# Patient Record
Sex: Male | Born: 1950
Health system: Southern US, Community
[De-identification: ages and names within clinical notes are randomized; demographics above are authoritative.]

## PROBLEM LIST (undated history)

## (undated) DIAGNOSIS — K9189 Other postprocedural complications and disorders of digestive system: Secondary | ICD-10-CM

## (undated) DIAGNOSIS — R351 Nocturia: Secondary | ICD-10-CM

## (undated) DIAGNOSIS — C801 Malignant (primary) neoplasm, unspecified: Secondary | ICD-10-CM

## (undated) DIAGNOSIS — G709 Myoneural disorder, unspecified: Secondary | ICD-10-CM

## (undated) DIAGNOSIS — Z9989 Dependence on other enabling machines and devices: Secondary | ICD-10-CM

## (undated) DIAGNOSIS — F419 Anxiety disorder, unspecified: Secondary | ICD-10-CM

## (undated) DIAGNOSIS — R0602 Shortness of breath: Secondary | ICD-10-CM

## (undated) DIAGNOSIS — K567 Ileus, unspecified: Secondary | ICD-10-CM

## (undated) DIAGNOSIS — M1990 Unspecified osteoarthritis, unspecified site: Secondary | ICD-10-CM

## (undated) DIAGNOSIS — E119 Type 2 diabetes mellitus without complications: Secondary | ICD-10-CM

## (undated) DIAGNOSIS — K649 Unspecified hemorrhoids: Secondary | ICD-10-CM

## (undated) DIAGNOSIS — H9190 Unspecified hearing loss, unspecified ear: Secondary | ICD-10-CM

## (undated) DIAGNOSIS — I10 Essential (primary) hypertension: Secondary | ICD-10-CM

## (undated) DIAGNOSIS — F32A Depression, unspecified: Secondary | ICD-10-CM

## (undated) DIAGNOSIS — K5909 Other constipation: Secondary | ICD-10-CM

## (undated) DIAGNOSIS — G4733 Obstructive sleep apnea (adult) (pediatric): Secondary | ICD-10-CM

## (undated) DIAGNOSIS — Z8719 Personal history of other diseases of the digestive system: Secondary | ICD-10-CM

## (undated) DIAGNOSIS — Z87898 Personal history of other specified conditions: Secondary | ICD-10-CM

## (undated) DIAGNOSIS — R350 Frequency of micturition: Secondary | ICD-10-CM

## (undated) DIAGNOSIS — E785 Hyperlipidemia, unspecified: Secondary | ICD-10-CM

## (undated) DIAGNOSIS — J189 Pneumonia, unspecified organism: Secondary | ICD-10-CM

## (undated) DIAGNOSIS — Z973 Presence of spectacles and contact lenses: Secondary | ICD-10-CM

## (undated) DIAGNOSIS — R972 Elevated prostate specific antigen [PSA]: Secondary | ICD-10-CM

## (undated) HISTORY — PX: JOINT REPLACEMENT: SHX530

## (undated) HISTORY — PX: VASECTOMY: SHX75

## (undated) HISTORY — PX: COLONOSCOPY W/ POLYPECTOMY: SHX1380

---
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
## 1998-10-14 ENCOUNTER — Ambulatory Visit (HOSPITAL_BASED_OUTPATIENT_CLINIC_OR_DEPARTMENT_OTHER): Admission: RE | Admit: 1998-10-14 | Discharge: 1998-10-14 | Payer: Self-pay | Admitting: Urology

## 2000-12-11 ENCOUNTER — Encounter: Payer: Self-pay | Admitting: Emergency Medicine

## 2000-12-11 ENCOUNTER — Emergency Department (HOSPITAL_COMMUNITY): Admission: EM | Admit: 2000-12-11 | Discharge: 2000-12-11 | Payer: Self-pay | Admitting: Emergency Medicine

## 2002-12-18 ENCOUNTER — Ambulatory Visit (HOSPITAL_COMMUNITY): Admission: RE | Admit: 2002-12-18 | Discharge: 2002-12-18 | Payer: Self-pay | Admitting: Gastroenterology

## 2002-12-18 ENCOUNTER — Encounter (INDEPENDENT_AMBULATORY_CARE_PROVIDER_SITE_OTHER): Payer: Self-pay | Admitting: Specialist

## 2006-02-01 ENCOUNTER — Ambulatory Visit (HOSPITAL_COMMUNITY): Admission: RE | Admit: 2006-02-01 | Discharge: 2006-02-01 | Payer: Self-pay | Admitting: Orthopedic Surgery

## 2006-05-09 HISTORY — PX: TOTAL KNEE ARTHROPLASTY: SHX125

## 2007-02-26 ENCOUNTER — Inpatient Hospital Stay (HOSPITAL_COMMUNITY): Admission: RE | Admit: 2007-02-26 | Discharge: 2007-03-01 | Payer: Self-pay | Admitting: Orthopedic Surgery

## 2007-04-03 ENCOUNTER — Encounter: Admission: RE | Admit: 2007-04-03 | Discharge: 2007-04-03 | Payer: Self-pay | Admitting: Gastroenterology

## 2010-05-09 HISTORY — PX: OTHER SURGICAL HISTORY: SHX169

## 2010-08-24 ENCOUNTER — Other Ambulatory Visit: Payer: Self-pay | Admitting: Urology

## 2010-08-24 ENCOUNTER — Ambulatory Visit (HOSPITAL_BASED_OUTPATIENT_CLINIC_OR_DEPARTMENT_OTHER)
Admission: RE | Admit: 2010-08-24 | Discharge: 2010-08-24 | Disposition: A | Discharge status: Home / Self Care | Payer: 59 | Source: Ambulatory Visit | Attending: Urology | Admitting: Urology

## 2010-08-24 DIAGNOSIS — K219 Gastro-esophageal reflux disease without esophagitis: Secondary | ICD-10-CM | POA: Insufficient documentation

## 2010-08-24 DIAGNOSIS — R972 Elevated prostate specific antigen [PSA]: Secondary | ICD-10-CM | POA: Insufficient documentation

## 2010-08-24 DIAGNOSIS — N400 Enlarged prostate without lower urinary tract symptoms: Secondary | ICD-10-CM | POA: Insufficient documentation

## 2010-08-24 DIAGNOSIS — I10 Essential (primary) hypertension: Secondary | ICD-10-CM | POA: Insufficient documentation

## 2010-08-24 DIAGNOSIS — G4733 Obstructive sleep apnea (adult) (pediatric): Secondary | ICD-10-CM | POA: Insufficient documentation

## 2010-08-24 DIAGNOSIS — E669 Obesity, unspecified: Secondary | ICD-10-CM | POA: Insufficient documentation

## 2010-08-24 DIAGNOSIS — R9431 Abnormal electrocardiogram [ECG] [EKG]: Secondary | ICD-10-CM | POA: Insufficient documentation

## 2010-08-24 DIAGNOSIS — Z01812 Encounter for preprocedural laboratory examination: Secondary | ICD-10-CM | POA: Insufficient documentation

## 2010-08-24 DIAGNOSIS — E119 Type 2 diabetes mellitus without complications: Secondary | ICD-10-CM | POA: Insufficient documentation

## 2010-08-24 DIAGNOSIS — Z01810 Encounter for preprocedural cardiovascular examination: Secondary | ICD-10-CM | POA: Insufficient documentation

## 2010-08-24 LAB — POCT I-STAT 4, (NA,K, GLUC, HGB,HCT)
Glucose, Bld: 147 mg/dL — ABNORMAL HIGH (ref 70–99)
Hemoglobin: 13.9 g/dL (ref 13.0–17.0)
Sodium: 141 mEq/L (ref 135–145)

## 2010-08-24 LAB — GLUCOSE, CAPILLARY: Glucose-Capillary: 134 mg/dL — ABNORMAL HIGH (ref 70–99)

## 2010-09-04 NOTE — Op Note (Signed)
  NAMESENDER, RUEB                ACCOUNT NO.:  [id number]  MEDICAL RECORD NO.:  [id number]          PATIENT TYPE:  LOCATION:                                 FACILITY:  PHYSICIAN:  Danae Chen, M.D.       DATE OF BIRTH:  DATE OF PROCEDURE:  08/24/2010 DATE OF DISCHARGE:                              OPERATIVE REPORT   PREOPERATIVE DIAGNOSIS:  Elevated prostate-specific antigen.  Rule out adenocarcinoma of the prostate.  POSTOPERATIVE DIAGNOSIS:  Elevated prostate-specific antigen.  Rule out adenocarcinoma of the prostate.  PROCEDURE DONE:  Ultrasound biopsy of the prostate.  SURGEON:  Danae Chen, M.D.  ANESTHESIA:  General.  INDICATIONS:  The patient is a 60 year old male who has an elevated PSA of 14.08.  He had a negative prostate biopsy in September 2008 for a PSA of 5.37.  His PSA has been trending up and it is now 14.08.  He is scheduled for ultrasound prostate biopsy.  The patient did not want to have a repeat biopsy under local anesthesia, so he is scheduled for the procedure under general anesthesia.  PROCEDURE IN DETAIL:  The patient was identified by his wrist band and a proper time-out was taken.  Under general anesthesia, he was prepped and draped and placed in the left lateral decubitus position.  The transducer was inserted in the rectum.  An ultrasound of the prostate was done.  The seminal vesicles appear normal.  The prostate gland is enlarged and measures 88.72 cc. The prostate width is 5.44 cm.  The height is 5 cm and the length is 6.20 cm.  There is no definite evidence of hypoechoic nodules.  Then two biopsies of the right base, right mid gland and right apex were done. Then two biopsies of the left base, the left mid gland and left apex were done.  There was no evidence of bleeding at the end of the procedure.  The transducer was removed.  The patient tolerated the procedure well and left the OR in satisfactory condition to the Post-Anesthesia  Care Unit.     Danae Chen, M.D.     MN/MEDQ  D:  08/24/2010  T:  08/24/2010  Job:  [id number]  Electronically Signed by Lindaann Slough M.D. on 09/04/2010 10:52:51 AM

## 2010-09-21 NOTE — Discharge Summary (Signed)
NAMECHRISTORPHER, [name]                ACCOUNT NO.:  [id number]   MEDICAL RECORD NO.:  [id number]          PATIENT TYPE:  INP   LOCATION:  5032                         FACILITY:  [facility]   PHYSICIAN:  Elana Alm. Thurston Hole, M.D. DATE OF BIRTH:  December 31, 1950   DATE OF ADMISSION:  02/26/2007  DATE OF DISCHARGE:  03/01/2007                               DISCHARGE SUMMARY   ADMITTING DIAGNOSES:  1. End-stage degenerative joint disease, left knee.  2. High cholesterol.  3. Depression.  4. Sleep apnea with the use of a continuous positive airway pressure.   DISCHARGE DIAGNOSES:  1. End-stage degenerative joint disease, left knee.  2. High cholesterol.  3. Depression.  4. Sleep apnea.  5. Ileus.  6. Obesity.  7. Leukocytosis.   HISTORY OF PRESENT ILLNESS:  The patient is a 60 year old white male  with a history of end-stage DJD of both knees.  His left is more painful  than his right knee.  He has failed conservative care, including  interarticular cortisone injections, anti-inflammatories, and physical  therapy.  He understands the risk, benefits, and possible complications  of a left total knee replacement and is without question.   PROCEDURES IN HOUSE:  On February 26, 2007, the patient underwent a left  total knee replacement and a femoral nerve block by anesthesia.  Intraoperatively, an Autovac was placed, and postoperatively he received  autologous transfusion of blood collected in Autovac.  He tolerated all  these procedures well.   In the recovery room, he was placed in a CPM.  He had difficulty with  nausea and vomiting.  A CPAP was brought to him.  He did not use it due  to his significant nausea and vomiting.  On the morning of postoperative  day #1, the patient continued to have significant nausea and vomiting.  His abdomen was distended and firm.  He had decreased bowel sounds.  Chest x-ray and CT were ordered that showed no abnormality.  A flat  plate abdomen was ordered that did  show an ileus.  GI consult was called  for [facility] to see the patient, and they recommended Reglan 10 mg IV  q.6h.  They recommended ambulation and sitting up in a chair.  An NG  tube was attempted to be placed at bedside and was unsuccessful.  Therefore, it was placed via fluoroscopic guidance.  There was  difficulty passing the NG tube through the esophageal junction, so he  will need followup for this.  He did not sleep with his CPAP for a  second night due to NG tube placement.   On postoperative day #2, NG tube was still in place.  The patient was  feeling better.  He was passing gas.  Still no bowel movement.  White  cell count of 16.9, T max of 100.5.  His Foley was discontinued.  He was  up with physical therapy. IV Reglan was continued.   On postoperative day #3, a trial of clear liquids was tried with  clamping his NG tube.  He tolerated this.  He was placed on MiraLax  p.r.n.  at home.  He did very well with physical therapy, ambulating 200  feet but only tolerating the CPM 0-50 degrees.   On postoperative day #4, the patient did better with the CPM, tolerating  it 0-70 degrees.  He was taking p.o.'s.  He had a successful bowel  movement.  He was discharged to home on Tylenol for pain, Lovenox 30 mg  p.o. b.i.d., Robaxin for muscle spasm, and Coumadin.  He will receive  home health physical therapy and skilled nursing.  A rolling walker was  ordered as well as an elevated commode seat.  He will follow up with Dr.  Thurston Hole on March 10, 2007.  He will follow up with GI in the next 2  weeks to have his possible esophageal stricture evaluated.  He is to  call us with increased pain, increased swelling, increased redness, or a  temperature greater than 101.      Kirstin Shepperson, P.A.      Robert A. Thurston Hole, M.D.  Electronically Signed    KS/MEDQ  D:  03/22/2007  T:  03/22/2007  Job:  [id number]

## 2010-09-21 NOTE — Op Note (Signed)
NAMEJAIS, DEMIR                ACCOUNT NO.:  [id number]   MEDICAL RECORD NO.:  [id number]          PATIENT TYPE:  INP   LOCATION:  2899                         FACILITY:  [facility]   PHYSICIAN:  Elana Alm. Thurston Hole, M.D. DATE OF BIRTH:  09-10-1950   DATE OF PROCEDURE:  DATE OF DISCHARGE:                               OPERATIVE REPORT   PREOPERATIVE DIAGNOSIS:  Left knee degenerative joint disease.   POSTOPERATIVE DIAGNOSIS:  Left knee degenerative joint disease.   PROCEDURE:  Left total knee replacing using DePuy cemented total knee  system with #4 cemented femur and #4 cemented tibia with 15-mm  polyethylene RP tibial spacer and 35-mm polyethylene cemented patella.   SURGEON:  Elana Alm. Thurston Hole, M.D.   ASSISTANT:  Julien Girt, P.A.   ANESTHESIA:  General.   OPERATIVE TIME:  1 hour and 20 minutes.   COMPLICATIONS:  None.   DESCRIPTION OF PROCEDURE:  Ms. Peloquin was brought to the operating room  on February 26, 2007 after a femoral nerve block was placed in the  holding room by anesthesia.  He was placed on the operative table in  supine position.  After being placed under general anesthesia, he  received Ancef 2 grams IV preoperatively for prophylaxis.  He had a  Foley catheter placed under sterile conditions.  His left knee was  examined under anesthesia.  He has range of motion -5 to 125 degrees,  mild varus deformity.  The knee is stable on ligamentous exam with  normal patellar tracking.  The left leg was prepped using sterile  DuraPrep and draped using sterile technique.  The leg was exsanguinated  and a thigh tourniquet elevated 365-mm.  Initially through a 15-cm  longitudinal incision based over the patella, initial exposure was made.  The underlying subcutaneous tissues were incised along with skin  incision.  A median arthrotomy was performed revealing an excessive  amount of normal appearing joint fluid.  The articular surfaces were  inspected.  He had grade IV  changes medially, laterally and in the  patellofemoral joint.  Osteophytes were removed from the femoral  condyles and tibial plateau.  The medial and lateral meniscal remnants  were removed, as well as the anterior cruciate ligament.  Intramedullary  drill was then drilled up the femoral canal for placement of distal  femoral cutting jig, which was placed in the appropriate amount of  rotation and a distal 11-mm cut was made.  The distal femur was incised.  A #4 was found to be appropriate size.  A #4 cutting jig was placed in  the appropriate amount of external rotation and then these cuts were  made.  The proximal tibia was then exposed.  The tibial spines were  removed with an oscillating saw.  Intramedullary drill was drilled down  the tibial canal for placement of the proximal tibial cutting jig, which  was placed in the appropriate amount of rotation and a 6-mm cut was made  based off the medial and lower side.  Spacer blocks were then placed in  flexion and extension.  15-mm blocks gave excellent balancing,  excellent  stability and excellent correction of his flexion and varus deformities.  At this point, the #4 tibial base plate trial was placed on the cut  tibial surface and a Keel cut was made.  A #4 PCL block cutter was  placed on the distal femur and then these cuts were made.  After this  done, then the patella was sized.  A resurfacing 10-mm cut was made and  three locking holes placed for a 35-mm patella.  At this point, the #4  femoral trial was placed and with a #4 tibial base plate trial and a 15-  mm polyethylene RP tibial spacer and a 35-mm patellar trial.  The trial  components were placed and the knee reduced, taken through a range of  motion from 0-125 degrees with excellent stability and excellent  correction of his flexion and varus deformities and normal patellar  tracking.  At this point, it was felt that all the trial components were  of excellent size, fit and  stability.  They were then removed.  The knee  was then jet lavaged and irrigated with 3 liters of saline.  The  proximal tibia was then exposed and the #4 tibial base plate with cement  backing was hammered in position with an excellent fit with excess  cement being removed from around the edges.  A #4 femoral component with  cement backing was hammered in position, also with an excellent fit with  excess cement being removed from around the edges.  The 15-mm  polyethylene RP tibial spacer was placed on tibial base plate and the  knee reduced, taken through a full range of motion 0-125 degrees with  excellent stability, excellent correction of his flexus and varus  deformities.  35-mm polyethylene cement-backed patella was then placed  in its position and held there with a clamp.  After the cement hardened,  again patellofemoral tracking was evaluated and found to be normal.  At  this point, it was felt that all the components were of excellent size,  fit and stability.  The wound was further irrigated with saline.  The  tourniquet was released.  Hemostasis was obtained with cautery.  The  arthrotomy was then closed with #1 Ethibond suture over 2 medium Hemovac  drains.  Subcutaneous tissues were closed with 0 and 2-0 Vicryl,  subcuticular layer closed with 4-0 Monocryl.  Sterile dressings and long-  leg splint applied.  Patient then awakened, extubated and taken to  recovery room in stable condition.  Needle and sponge counts correct x2  at the end of the case.  Neurovascular status and pulses 2+ and  symmetric postoperatively.      Robert A. Thurston Hole, M.D.  Electronically Signed     RAW/MEDQ  D:  02/26/2007  T:  02/26/2007  Job:  [id number]

## 2010-09-21 NOTE — Consult Note (Signed)
NAMEJORIAN, WILLHOITE NO.:  [id number]   MEDICAL RECORD NO.:  [id number]          PATIENT TYPE:  INP   LOCATION:  5032                         FACILITY:  [facility]   PHYSICIAN:  Shirley Friar, MDDATE OF BIRTH:  12-Oct-1950   DATE OF CONSULTATION:  02/27/2007  DATE OF DISCHARGE:                                 CONSULTATION   REASON FOR CONSULTATION:  We were asked to see Mr. Fines today in  consultation by Julien Girt, P.A. for Dr. Elana Alm. Wainer for  postop ileus.   HISTORY OF PRESENT ILLNESS:  This is a very pleasant 60 year old male  who underwent a left total knee replacement on October 20 for severe  degenerative joint disease.  He is currently dry heaving and retching.  He states that he has never experienced these symptoms previously and  that his bowel movements prior to surgery were every day and easy to  pass.  He has not seen any blood in his bowel movements.  He currently  has a small amount of bright red blood in his emesis secondary to NG  trauma as his RNs have recently tried to place an NG.  Mr. Wey tells  me that he has had a colonoscopy in the recent years for screening but  cannot currently remember who did it.  History taking was limited due to  his current discomfort.   PAST MEDICAL HISTORY:  Is significant for degenerative joint disease,  depression, high cholesterol, obesity and obstructive sleep apnea.  He  is status post appendectomy in 1963.  He has had a vasectomy.   PRIMARY CARE PHYSICIAN:  Georgann Housekeeper, MD   CURRENT MEDICATIONS:  1. Crestor.  2. Diclofenac.  3. MVT.  4. Fish oil.  5. Tylenol.  6. Fluoxetine.  7. Hydrocodone.   ALLERGIES:  No known drug allergies.   Review of systems, social history, family history were not collected as  the patient was retching.   PHYSICAL EXAM:  He is alert and oriented.  He is in some distress and  discomfort.  His temperature is 98.1.  Earlier this morning he had a  temperature of 100.6.  Respirations are 18, pulse is 91, blood pressure  is 137/78.  His heart has a regular rate and rhythm.  His lungs were  clear to auscultation.  His abdomen is distended, has very few but  tympanic bowel sounds.  He is nontender.   Current labs show a good potassium of 4.1, BUN of 9, creatinine 0.96,  glucose 168.  His white blood count is elevated at 21.4, hemoglobin  13.2, hematocrit 38.1, platelets 295,000.  Abdominal x-ray done today  shows a markedly distended stomach with gas.   ASSESSMENT:  Dr. Charlott Rakes has seen and examined the patient and  collected a history and reviewed his chart.  His impression is that this  is a 60 year old male with postop ileus, agree with Kirstin Shepperson's  recommendations of NG tube placement to low or intermittent suction as  well as discontinuing opiate pain medications and making the patient  n.p.o.  Will also add  Reglan and ask interventional radiology to place  the NG tube, assist the patient with ambulation as much as possible as  permitted by his primary care team, orthopedics.  If no results in 24-48  hours could do a trial of erythromycin.  Thanks very much for this  consultation.      Stephani Police, [place]      Shirley Friar, MD  Electronically Signed    MLY/MEDQ  D:  02/27/2007  T:  02/28/2007  Job:  [id number]   cc:   Elana Alm. Thurston Hole, M.D.  Georgann Housekeeper, MD  Shirley Friar, MD

## 2010-09-24 NOTE — Op Note (Signed)
   NAME:  [name], [name]                          ACCOUNT NO.:  [id number]   MEDICAL RECORD NO.:  [id number]                   PATIENT TYPE:  AMB   LOCATION:  ENDO                                 FACILITY:  [facility]   PHYSICIAN:  Danise Edge, M.D.                DATE OF BIRTH:  June 07, 1950   DATE OF PROCEDURE:  12/18/2002  DATE OF DISCHARGE:                                 OPERATIVE REPORT   PROCEDURE:  Colonoscopy.   PROCEDURE INDICATION:  Mr. [name] is a 60 year old male scheduled to  undergo his first screening colonoscopy with polypectomy to prevent colon  cancer.  Mr. [name] was born 1951-03-15.   ENDOSCOPIST:  Charolett Bumpers, M.D.   PREMEDICATION:  Versed 10 mg, Demerol 75 mg.   PROCEDURE:  After obtaining informed consent, Mr. [name] was placed in the  left lateral decubitus position.  I administered intravenous Demerol and  intravenous Versed to achieve conscious sedation for the procedure.  The  patient's blood pressure, oxygen saturation, and cardiac rhythm were  monitored throughout the procedure and documented in the medical record.   Anal inspection was normal.  Digital rectal exam revealed a non-nodular  prostate.  The Olympus adult colonoscope was introduced into the rectum and  advanced to the cecum.  Colonic preparation for the exam today was  excellent.   Rectum:  From the mid rectum a 2-mm sessile polyp was removed with the hot  biopsy forceps.   Sigmoid colon and descending colon:  At 60 cm from the anal verge a 2-mm  sessile polyp was removed with hot biopsy forceps.   Splenic flexure:  Normal.   Transverse colon:  From the proximal transverse colon a 1-mm sessile polyp  was removed with the cold biopsy forceps.   Hepatic flexure:  Normal.   Ascending colon:  Normal.   Cecum and ileocecal valve:  Normal.   ASSESSMENT:  A 1-mm polyp was removed from the proximal transverse colon, a  2-mm polyp was removed from the left colon at 60 cm  from the anal verge, and  a 2-mm polyp was removed from the mid rectum.  All polyps were submitted in  one bottle for pathologic evaluation.    RECOMMENDATIONS:  If polyps return neoplastic pathologically, Mr. [name]  should undergo a repeat colonoscopy in five years.                                               Danise Edge, M.D.    MJ/MEDQ  D:  12/18/2002  T:  12/18/2002  Job:  [id number]   cc:   Georgann Housekeeper, M.D.  [address]  [place]  [place] [address]  Fax: [phone]

## 2011-02-16 LAB — CBC
HCT: 38.1 — ABNORMAL LOW
Hemoglobin: 12.1 — ABNORMAL LOW
Hemoglobin: 13.2
MCHC: 34.6
MCHC: 35.2
MCHC: 35.6
MCV: 90.1
MCV: 90.9
Platelets: 263
Platelets: 266
Platelets: 295
RBC: 3.78 — ABNORMAL LOW
RBC: 4.19 — ABNORMAL LOW
RDW: 13.4
RDW: 13.5
RDW: 13.8
WBC: 15.5 — ABNORMAL HIGH
WBC: 21.4 — ABNORMAL HIGH

## 2011-02-16 LAB — URINALYSIS, ROUTINE W REFLEX MICROSCOPIC
Bilirubin Urine: NEGATIVE
Glucose, UA: NEGATIVE
Ketones, ur: NEGATIVE
Leukocytes, UA: NEGATIVE
Nitrite: NEGATIVE
Protein, ur: NEGATIVE
Specific Gravity, Urine: >1.046 — ABNORMAL HIGH
Urobilinogen, UA: 0.2
pH: 5

## 2011-02-16 LAB — PROTIME-INR
INR: 1
INR: 1.2
Prothrombin Time: 13.3
Prothrombin Time: 14.6
Prothrombin Time: 15.5 — ABNORMAL HIGH

## 2011-02-16 LAB — URINE MICROSCOPIC-ADD ON

## 2011-02-16 LAB — CULTURE, BLOOD (ROUTINE X 2)
Culture: NO GROWTH
Culture: NO GROWTH

## 2011-02-16 LAB — BASIC METABOLIC PANEL WITH GFR
BUN: 10
BUN: 9
CO2: 25
CO2: 27
Calcium: 8.8
Calcium: 8.9
Chloride: 100
Chloride: 106
Creatinine, Ser: 0.96
Creatinine, Ser: 0.99
GFR calc non Af Amer: >60
GFR calc non Af Amer: >60
Glucose, Bld: 137 — ABNORMAL HIGH
Glucose, Bld: 168 — ABNORMAL HIGH
Potassium: 3.8
Potassium: 4.1
Sodium: 136
Sodium: 141

## 2011-02-16 LAB — URINE CULTURE
Colony Count: NO GROWTH
Culture: NO GROWTH
Special Requests: NEGATIVE

## 2011-02-16 LAB — BASIC METABOLIC PANEL
CO2: 28
Sodium: 142

## 2011-02-17 LAB — PROTIME-INR: INR: 0.9

## 2011-02-17 LAB — COMPREHENSIVE METABOLIC PANEL
Albumin: 4
BUN: 9
Chloride: 106
Creatinine, Ser: 1
Total Bilirubin: 0.8
Total Protein: 7.2

## 2011-02-17 LAB — URINALYSIS, ROUTINE W REFLEX MICROSCOPIC
Hgb urine dipstick: NEGATIVE
Nitrite: NEGATIVE
Specific Gravity, Urine: 1.017
Urobilinogen, UA: 0.2

## 2011-02-17 LAB — DIFFERENTIAL
Basophils Absolute: 0
Lymphocytes Relative: 30
Monocytes Absolute: 0.8 — ABNORMAL HIGH
Neutro Abs: 6.8

## 2011-02-17 LAB — CBC
HCT: 42
MCV: 91.6
Platelets: 325
RDW: 13.8

## 2011-02-17 LAB — APTT: aPTT: 27

## 2011-02-17 LAB — URINE CULTURE

## 2011-02-17 LAB — ABO/RH: ABO/RH(D): A POS

## 2011-02-17 LAB — TYPE AND SCREEN

## 2012-07-22 ENCOUNTER — Emergency Department (INDEPENDENT_AMBULATORY_CARE_PROVIDER_SITE_OTHER): Payer: 59

## 2012-07-22 ENCOUNTER — Emergency Department (HOSPITAL_COMMUNITY)
Admission: EM | Admit: 2012-07-22 | Discharge: 2012-07-22 | Disposition: A | Discharge status: Home / Self Care | Payer: 59 | Source: Home / Self Care | Attending: Emergency Medicine | Admitting: Emergency Medicine

## 2012-07-22 ENCOUNTER — Encounter (HOSPITAL_COMMUNITY): Payer: Self-pay

## 2012-07-22 DIAGNOSIS — J40 Bronchitis, not specified as acute or chronic: Secondary | ICD-10-CM

## 2012-07-22 DIAGNOSIS — J069 Acute upper respiratory infection, unspecified: Secondary | ICD-10-CM

## 2012-07-22 HISTORY — DX: Essential (primary) hypertension: I10

## 2012-07-22 MED ORDER — IPRATROPIUM BROMIDE 0.02 % IN SOLN
0.5000 mg | Freq: Once | RESPIRATORY_TRACT | Status: AC
  Administered 2012-07-22: 0.5 mg via RESPIRATORY_TRACT

## 2012-07-22 MED ORDER — HYDROCOD POLST-CHLORPHEN POLST 10-8 MG/5ML PO LQCR: 5.0000 mL | Freq: Two times a day (BID) | ORAL | Status: DC | PRN

## 2012-07-22 MED ORDER — AZITHROMYCIN 250 MG PO TABS: 250.0000 mg | ORAL_TABLET | Freq: Every day | ORAL | Status: DC

## 2012-07-22 MED ORDER — ALBUTEROL SULFATE (5 MG/ML) 0.5% IN NEBU
INHALATION_SOLUTION | RESPIRATORY_TRACT | Status: AC
  Filled 2012-07-22: qty 1

## 2012-07-22 MED ORDER — ALBUTEROL SULFATE (5 MG/ML) 0.5% IN NEBU
5.0000 mg | INHALATION_SOLUTION | Freq: Once | RESPIRATORY_TRACT | Status: AC
  Administered 2012-07-22: 5 mg via RESPIRATORY_TRACT

## 2012-07-22 MED ORDER — BENZONATATE 100 MG PO CAPS: 100.0000 mg | ORAL_CAPSULE | Freq: Three times a day (TID) | ORAL | Status: DC | PRN

## 2012-07-22 MED ORDER — ALBUTEROL SULFATE HFA 108 (90 BASE) MCG/ACT IN AERS: 1.0000 | INHALATION_SPRAY | Freq: Four times a day (QID) | RESPIRATORY_TRACT | Status: DC | PRN

## 2012-07-22 NOTE — ED Notes (Signed)
States  he has been sick since last week when he was exposed to ill grandchild

## 2012-07-26 NOTE — ED Provider Notes (Deleted)
History     CSN: [id number]  Arrival date & time 07/22/12  1308   First MD Initiated Contact with Patient 07/22/12 1337      Chief Complaint  Patient presents with  . URI    HPI: Patient is a 62 y.o. male presenting with URI. The history is provided by the patient.  URI Presenting symptoms: congestion, cough, fatigue and sore throat   Presenting symptoms: no ear pain, no facial pain, no fever and no rhinorrhea   Congestion:    Location:  Chest   Interferes with sleep: yes     Interferes with eating/drinking: no   Cough:    Cough characteristics:  Productive and harsh   Sputum characteristics:  Green and yellow   Severity:  Severe   Onset quality:  Gradual   Duration:  3 days   Timing:  Intermittent   Progression:  Worsening   Chronicity:  Recurrent Sore throat:    Severity:  Mild   Onset quality:  Gradual   Timing:  Constant   Progression:  Improving Chronicity:  New Ineffective treatments:  None tried Associated symptoms: no sneezing and no wheezing   Risk factors: being elderly, chronic respiratory disease and sick contacts   Pt reports 3 to 4 days URI symptoms. Denies fever. States started as increased PND and mild throat irritation that progressed to lots of "chest congestion" then persistent coughing. The sore throat has improved but the cough has worsened. Cough is productive of yellowish-green secretions and makes sleeping difficult. States his wife and grandson have had similar symptoms. Pt does not smoke and has no h/o asthma, however states he has had numerous episodes of Bronchitis. Denies CP or SOB.   Past Medical History  Diagnosis Date  . Diabetes mellitus without complication   . Hypertension   . High cholesterol     Past Surgical History  Procedure Laterality Date  . Tonsillectomy    . Vasectomy    . Knee surgery      History reviewed. No pertinent family history.  History  Substance Use Topics  . Smoking status: Never Smoker   . Smokeless  tobacco: Not on file  . Alcohol Use: No      Review of Systems  Constitutional: Positive for fatigue. Negative for fever.  HENT: Positive for congestion and sore throat. Negative for ear pain, rhinorrhea, sneezing, trouble swallowing and sinus pressure.   Respiratory: Positive for cough. Negative for shortness of breath and wheezing.   Cardiovascular: Negative for chest pain.  Gastrointestinal: Negative for nausea, vomiting, abdominal pain, diarrhea and abdominal distention.  Endocrine: Negative.   Genitourinary: Negative.   Musculoskeletal: Negative.   Skin: Negative.   Allergic/Immunologic: Negative.   Neurological: Negative.   Hematological: Negative.   Psychiatric/Behavioral: Negative.     Allergies  Review of patient's allergies indicates no known allergies.  Home Medications   Current Outpatient Rx  Name  Route  Sig  Dispense  Refill  . FLUoxetine (PROZAC) 20 MG tablet   Oral   Take 20 mg by mouth daily.         . metFORMIN (GLUCOPHAGE) 500 MG tablet   Oral   Take 500 mg by mouth 3 (three) times daily.         . ramipril (ALTACE) 10 MG tablet   Oral   Take 10 mg by mouth daily.         . simvastatin (ZOCOR) 10 MG tablet   Oral   Take 10 mg  by mouth at bedtime.         . sitaGLIPtin (JANUVIA) 25 MG tablet   Oral   Take 25 mg by mouth daily.         Marland Kitchen albuterol (PROVENTIL HFA;VENTOLIN HFA) 108 (90 BASE) MCG/ACT inhaler   Inhalation   Inhale 1-2 puffs into the lungs every 6 (six) hours as needed for wheezing or shortness of breath (and or coughing).   1 Inhaler   0   . azithromycin (ZITHROMAX Z-PAK) 250 MG tablet   Oral   Take 1 tablet (250 mg total) by mouth daily. Take 2 tabs on day 1 then 1 tab daily on days 2-5   6 tablet   0   . benzonatate (TESSALON) 100 MG capsule   Oral   Take 1 capsule (100 mg total) by mouth 3 (three) times daily as needed for cough.   21 capsule   0   . chlorpheniramine-HYDROcodone (TUSSIONEX PENNKINETIC ER)  10-8 MG/5ML LQCR   Oral   Take 5 mLs by mouth every 12 (twelve) hours as needed.   40 mL   0     BP 135/75  Pulse 77  Temp(Src) 98.5 F (36.9 C) (Oral)  Resp 21  SpO2 98%  Physical Exam  Constitutional: He is oriented to person, place, and time. He appears well-developed and well-nourished.  HENT:  Head: Normocephalic and atraumatic.  Right Ear: Tympanic membrane, external ear and ear canal normal.  Left Ear: Tympanic membrane, external ear and ear canal normal.  Nose: Nose normal.  Mouth/Throat: Uvula is midline, oropharynx is clear and moist and mucous membranes are normal.  Cobblestoning   Eyes: Conjunctivae are normal.  Neck: Neck supple.  Cardiovascular: Normal rate and regular rhythm.   Pulmonary/Chest: Effort normal. He has wheezes.  BBS diminished w/ mild expiratory wheezes.  Musculoskeletal: Normal range of motion.  Neurological: He is alert and oriented to person, place, and time.  Skin: Skin is warm and dry.  Psychiatric: He has a normal mood and affect.    ED Course  Procedures (including critical care time)  Labs Reviewed - No data to display No results found.   1. Bronchitis   2. Upper respiratory infection       MDM  3-4 days URI symptoms. Worse symptoms are sore throat and persistent cough. Mx family members w/ similar sx's. No fever. CXR negative for PNA. Hx significant for mx episodes of Bronchitis. Will treat for same w/ Z-Pack, Albuterol HFA, Tessalon Pearles and short course of medication for cough to use primarily at night. Pt is to arrange f/u w/ PCP. Pt agreeable w/ plan.        Leanne Chang, NP 07/26/12 0300  Leanne Chang, NP 07/26/12 1610

## 2013-05-07 ENCOUNTER — Emergency Department (HOSPITAL_COMMUNITY)
Admission: EM | Admit: 2013-05-07 | Discharge: 2013-05-07 | Disposition: A | Discharge status: Home / Self Care | Payer: 59 | Attending: Emergency Medicine | Admitting: Emergency Medicine

## 2013-05-07 ENCOUNTER — Encounter (HOSPITAL_COMMUNITY): Payer: Self-pay | Admitting: Emergency Medicine

## 2013-05-07 ENCOUNTER — Emergency Department (HOSPITAL_COMMUNITY): Payer: 59

## 2013-05-07 DIAGNOSIS — M5412 Radiculopathy, cervical region: Secondary | ICD-10-CM | POA: Insufficient documentation

## 2013-05-07 DIAGNOSIS — Z9089 Acquired absence of other organs: Secondary | ICD-10-CM | POA: Insufficient documentation

## 2013-05-07 DIAGNOSIS — R209 Unspecified disturbances of skin sensation: Secondary | ICD-10-CM | POA: Insufficient documentation

## 2013-05-07 DIAGNOSIS — E785 Hyperlipidemia, unspecified: Secondary | ICD-10-CM | POA: Insufficient documentation

## 2013-05-07 DIAGNOSIS — Z7982 Long term (current) use of aspirin: Secondary | ICD-10-CM | POA: Insufficient documentation

## 2013-05-07 DIAGNOSIS — M25519 Pain in unspecified shoulder: Secondary | ICD-10-CM | POA: Insufficient documentation

## 2013-05-07 DIAGNOSIS — R05 Cough: Secondary | ICD-10-CM | POA: Insufficient documentation

## 2013-05-07 DIAGNOSIS — J3489 Other specified disorders of nose and nasal sinuses: Secondary | ICD-10-CM | POA: Insufficient documentation

## 2013-05-07 DIAGNOSIS — E119 Type 2 diabetes mellitus without complications: Secondary | ICD-10-CM | POA: Insufficient documentation

## 2013-05-07 DIAGNOSIS — Z79899 Other long term (current) drug therapy: Secondary | ICD-10-CM | POA: Insufficient documentation

## 2013-05-07 DIAGNOSIS — R059 Cough, unspecified: Secondary | ICD-10-CM | POA: Insufficient documentation

## 2013-05-07 DIAGNOSIS — I10 Essential (primary) hypertension: Secondary | ICD-10-CM | POA: Insufficient documentation

## 2013-05-07 LAB — POCT I-STAT TROPONIN I: Troponin i, poc: 0 ng/mL (ref 0.00–0.08)

## 2013-05-07 MED ORDER — HYDROCODONE-ACETAMINOPHEN 5-325 MG PO TABS: 1.0000 | ORAL_TABLET | ORAL | Status: DC | PRN

## 2013-05-07 NOTE — ED Notes (Signed)
Pt reports pain in his left shoulder that radiates down to his arm and fingers. Reports his fingers are tingling.Pt also reports pain radiates to his back, sts taken ibuprofen and used icyhot without relief.

## 2013-05-07 NOTE — ED Provider Notes (Signed)
Medical screening examination/treatment/procedure(s) were performed by non-physician practitioner and as supervising physician I was immediately available for consultation/collaboration.  EKG Interpretation   None         Enid Skeens, MD 05/07/13 [phone]

## 2013-05-07 NOTE — ED Provider Notes (Signed)
CSN: [id number]     Arrival date & time 05/07/13  1217 History  This chart was scribed for non-physician practitioner Trixie Dredge, PA-C working with Enid Skeens, MD by Valera Castle, ED scribe. This patient was seen in room WTR1/WLPT1 and the patient's care was started at 12:57 PM.    Chief Complaint  Patient presents with  . Shoulder Pain    The history is provided by the patient. No language interpreter was used.   HPI Comments: [name] is a 62 y.o. male who presents to the Emergency Department complaining of gradually worsening, moderate, constant, left shoulder pain, that radiates to his left shoulder blade and down his left arm to his fingers, onset 4-5 days ago. He reports mild loss of sensation in his left fingers, and states they feel stiff. He reports associated neck pain. He reports standing up sometimes helps relieve the pain, as opposed to sitting down. He reports that sometimes moving, walking helps relieve his pain, but sometimes it will exacerbate his pain. He denies any strenuous activities at his work. He reports trouble sleeping due to the pain, stating he tosses and turns a lot. He reports having tried icy hot over the area, with temporary relief, and reports taking Ibuprofen with relief as well. He denies recent trauma to his shoulder, but reports having similar left shoulder pain 1 month ago after developing a sharp pain while lifting something. He denies his previous shoulder pain radiating like his pain is radiating currently. He also reports mild, intermittent, dry cough and mild congestion. He reports h/o allergies. He denies chest pain, SOB, light-headedness, dizziness, and any other associated symptoms. He reports having Type 2 DM, HTN, hypercholesterolemia.    PCP Georgann Housekeeper, MD  Past Medical History  Diagnosis Date  . Diabetes mellitus without complication   . Hypertension   . High cholesterol    Past Surgical History  Procedure Laterality Date  .  Tonsillectomy    . Vasectomy    . Knee surgery     History reviewed. No pertinent family history. History  Substance Use Topics  . Smoking status: Never Smoker   . Smokeless tobacco: Not on file  . Alcohol Use: No    Review of Systems  HENT: Positive for congestion.   Respiratory: Positive for cough (dry). Negative for shortness of breath.   Musculoskeletal: Positive for arthralgias (left shoulder) and neck pain.  Neurological: Positive for numbness (left fingers). Negative for dizziness and light-headedness.    Allergies  Review of patient's allergies indicates no known allergies.  Home Medications   Current Outpatient Rx  Name  Route  Sig  Dispense  Refill  . aspirin 81 MG chewable tablet   Oral   Chew 81 mg by mouth daily.         . calcium-vitamin D (OSCAL WITH D) 500-200 MG-UNIT per tablet   Oral   Take 1 tablet by mouth 2 (two) times daily.         Marland Kitchen FLUoxetine (PROZAC) 20 MG tablet   Oral   Take 20 mg by mouth daily.         Marland Kitchen GARLIC PO   Oral   Take 2 tablets by mouth daily.         Marland Kitchen ibuprofen (ADVIL,MOTRIN) 200 MG tablet   Oral   Take 800 mg by mouth every 6 (six) hours as needed for moderate pain.          . Menthol, Topical Analgesic, (  ICY HOT EX)   Apply externally   Apply topically.         . metFORMIN (GLUCOPHAGE) 500 MG tablet   Oral   Take 500 mg by mouth 3 (three) times daily.         . Multiple Vitamin (MULTIVITAMIN WITH MINERALS) TABS tablet   Oral   Take 1 tablet by mouth daily.         . Omega-3 Fatty Acids (FISH OIL) 1000 MG CPDR   Oral   Take 2,000 mg by mouth 2 (two) times daily.         . ramipril (ALTACE) 10 MG tablet   Oral   Take 10 mg by mouth daily.         . simvastatin (ZOCOR) 10 MG tablet   Oral   Take 10 mg by mouth at bedtime.         . sitaGLIPtin (JANUVIA) 25 MG tablet   Oral   Take 25 mg by mouth daily.          BP 172/77  Pulse 76  Temp(Src) 98 F (36.7 C)  Resp 16  SpO2  97%  Physical Exam  Nursing note and vitals reviewed. Constitutional: He appears well-developed and well-nourished. No distress.  HENT:  Head: Normocephalic and atraumatic.  Neck: Neck supple.  Pulmonary/Chest: Effort normal. He exhibits no tenderness.  Musculoskeletal:  Spine nontender, no crepitus, or stepoffs. Left upper extremity:  Strength 5/5, sensation intact but decreased through palmar aspect of all 5 fingers, distal pulses intact.     Neurological: He is alert.  Skin: He is not diaphoretic.    ED Course  Procedures (including critical care time)  DIAGNOSTIC STUDIES: Oxygen Saturation is 97% on room air, normal by my interpretation.    COORDINATION OF CARE: 1:06 PM-Discussed treatment plan which includes left shoulder xray and CT with pt at bedside and pt agreed to plan.   1:20 PM - Discussed pt's high BP with pt. Will recheck BP. Advised pt to recheck BP when he is not in pain and to f/u with PCP.  Labs Review Labs Reviewed  POCT I-STAT TROPONIN I   Imaging Review Dg Cervical Spine Complete  05/07/2013   CLINICAL DATA:  Pain in back of lower neck for 45 days. No known injury.  EXAM: CERVICAL SPINE  4+ VIEWS  COMPARISON:  None.  FINDINGS: There is no evidence of cervical spine fracture or prevertebral soft tissue swelling. Alignment is normal. No other significant bone abnormalities are identified.  Moderate disc space narrowing C5-6 is chronic. Anterior osseous bridging C3-4 also a chronic finding. Right-sided foraminal narrowing most notable C5-C6. Left-sided foraminal narrowing present at C4-5 and C5-6. Nuchal ligament calcification was observed on previous report from 2002.  IMPRESSION: Chronic degenerative change.  No acute findings.   Electronically Signed   By: Davonna Belling M.D.   On: 05/07/2013 13:30   Dg Shoulder Left  05/07/2013   CLINICAL DATA:  Pain.  EXAM: LEFT SHOULDER - 2+ VIEW  COMPARISON:  None.  FINDINGS: Degenerative changes left shoulder. No evidence  of fracture or dislocation.  IMPRESSION: Negative.   Electronically Signed   By: Maisie Fus  Register   On: 05/07/2013 13:17    EKG Interpretation   None      Meds ordered this encounter  Medications  . ibuprofen (ADVIL,MOTRIN) 200 MG tablet    Sig: Take 800 mg by mouth every 6 (six) hours as needed for moderate pain.   Marland Kitchen  Menthol, Topical Analgesic, (ICY HOT EX)    Sig: Apply topically.  Marland Kitchen GARLIC PO    Sig: Take 2 tablets by mouth daily.  . Omega-3 Fatty Acids (FISH OIL) 1000 MG CPDR    Sig: Take 2,000 mg by mouth 2 (two) times daily.  . Multiple Vitamin (MULTIVITAMIN WITH MINERALS) TABS tablet    Sig: Take 1 tablet by mouth daily.  . calcium-vitamin D (OSCAL WITH D) 500-200 MG-UNIT per tablet    Sig: Take 1 tablet by mouth 2 (two) times daily.  Marland Kitchen aspirin 81 MG chewable tablet    Sig: Chew 81 mg by mouth daily.    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 Left cervical radiculopathy    Pt with left shoulder and left arm pain with decreased sensation in his fingertips, not of dermatomal distribution, pain also in neck and pain is exacerbated with moving neck in certain directions.  Pt does have HTN, hyperlipidemia and DM and I therefore ordered EKG and troponin - EKG shows no concerning ischemic changes and troponin is negative after 4 days of constant pain.  C-spine film shows several areas of degenerative changes including disc space and foraminal narrowing.  Shoulder xray is negative.  This is most likely cervical radiculopathy.  There is no weakness.  I do not think there is a need for urgent intervention or further imaging at this time.  Pulses are normal.  Pt requests follow up with Dr Jeral Fruit, who has treated other family  members.  Pt d/c home with norco, neurosurgery follow up.  Discussed result, findings, treatment, and follow up  with patient.  Pt given return precautions.  Pt verbalizes understanding and agrees with plan.     I doubt any other EMC precluding discharge at this time including, but not necessarily limited to the following: ACS   I personally performed the services described in this documentation, which was scribed in my presence. The recorded information has been reviewed and is accurate.    Trixie Dredge, PA-C 05/07/13 1435

## 2013-05-09 HISTORY — PX: ANTERIOR CERVICAL DECOMP/DISCECTOMY FUSION: SHX1161

## 2014-05-22 ENCOUNTER — Other Ambulatory Visit (HOSPITAL_COMMUNITY): Payer: Self-pay | Admitting: Urology

## 2014-05-22 DIAGNOSIS — R972 Elevated prostate specific antigen [PSA]: Secondary | ICD-10-CM

## 2014-06-10 ENCOUNTER — Ambulatory Visit (HOSPITAL_COMMUNITY)
Admission: RE | Admit: 2014-06-10 | Discharge: 2014-06-10 | Disposition: A | Discharge status: Home / Self Care | Payer: 59 | Source: Ambulatory Visit | Attending: Urology | Admitting: Urology

## 2014-06-10 DIAGNOSIS — R972 Elevated prostate specific antigen [PSA]: Secondary | ICD-10-CM | POA: Insufficient documentation

## 2014-06-10 LAB — POCT I-STAT CREATININE: CREATININE: 1.1 mg/dL (ref 0.50–1.35)

## 2014-06-10 MED ORDER — GADOBENATE DIMEGLUMINE 529 MG/ML IV SOLN
20.0000 mL | Freq: Once | INTRAVENOUS | Status: AC | PRN
  Administered 2014-06-10: 20 mL via INTRAVENOUS

## 2014-06-13 ENCOUNTER — Encounter: Payer: Self-pay | Admitting: Internal Medicine

## 2014-06-17 ENCOUNTER — Other Ambulatory Visit: Payer: Self-pay | Admitting: Urology

## 2014-06-26 ENCOUNTER — Encounter (HOSPITAL_BASED_OUTPATIENT_CLINIC_OR_DEPARTMENT_OTHER): Payer: Self-pay | Admitting: *Deleted

## 2014-06-26 NOTE — Progress Notes (Signed)
NPO AFTER MN. ARRIVE AT 0715. NEEDS ISTAT 8 AND EKG. WILL TAKE COLACE AM DOS W/ SIPS OF WATER AND DO FLEET ENEMA.

## 2014-07-01 ENCOUNTER — Ambulatory Visit (HOSPITAL_BASED_OUTPATIENT_CLINIC_OR_DEPARTMENT_OTHER): Payer: 59 | Admitting: Anesthesiology

## 2014-07-01 ENCOUNTER — Encounter (HOSPITAL_BASED_OUTPATIENT_CLINIC_OR_DEPARTMENT_OTHER): Payer: Self-pay | Admitting: *Deleted

## 2014-07-01 ENCOUNTER — Ambulatory Visit (HOSPITAL_BASED_OUTPATIENT_CLINIC_OR_DEPARTMENT_OTHER)
Admission: RE | Admit: 2014-07-01 | Discharge: 2014-07-01 | Disposition: A | Discharge status: Home / Self Care | Payer: 59 | Source: Ambulatory Visit | Attending: Urology | Admitting: Urology

## 2014-07-01 ENCOUNTER — Encounter (HOSPITAL_BASED_OUTPATIENT_CLINIC_OR_DEPARTMENT_OTHER)
Admission: RE | Disposition: A | Discharge status: Home / Self Care | Payer: Self-pay | Source: Ambulatory Visit | Attending: Urology

## 2014-07-01 DIAGNOSIS — K219 Gastro-esophageal reflux disease without esophagitis: Secondary | ICD-10-CM | POA: Diagnosis not present

## 2014-07-01 DIAGNOSIS — N410 Acute prostatitis: Secondary | ICD-10-CM | POA: Diagnosis not present

## 2014-07-01 DIAGNOSIS — R351 Nocturia: Secondary | ICD-10-CM | POA: Diagnosis not present

## 2014-07-01 DIAGNOSIS — N401 Enlarged prostate with lower urinary tract symptoms: Secondary | ICD-10-CM | POA: Insufficient documentation

## 2014-07-01 DIAGNOSIS — Z7982 Long term (current) use of aspirin: Secondary | ICD-10-CM | POA: Insufficient documentation

## 2014-07-01 DIAGNOSIS — N411 Chronic prostatitis: Secondary | ICD-10-CM | POA: Diagnosis not present

## 2014-07-01 DIAGNOSIS — F419 Anxiety disorder, unspecified: Secondary | ICD-10-CM | POA: Insufficient documentation

## 2014-07-01 DIAGNOSIS — F329 Major depressive disorder, single episode, unspecified: Secondary | ICD-10-CM | POA: Diagnosis not present

## 2014-07-01 DIAGNOSIS — F1590 Other stimulant use, unspecified, uncomplicated: Secondary | ICD-10-CM | POA: Diagnosis not present

## 2014-07-01 DIAGNOSIS — E780 Pure hypercholesterolemia: Secondary | ICD-10-CM | POA: Diagnosis not present

## 2014-07-01 DIAGNOSIS — C61 Malignant neoplasm of prostate: Secondary | ICD-10-CM | POA: Diagnosis present

## 2014-07-01 DIAGNOSIS — Z886 Allergy status to analgesic agent status: Secondary | ICD-10-CM | POA: Diagnosis not present

## 2014-07-01 HISTORY — DX: Obstructive sleep apnea (adult) (pediatric): G47.33

## 2014-07-01 HISTORY — DX: Dependence on other enabling machines and devices: Z99.89

## 2014-07-01 HISTORY — DX: Other constipation: K59.09

## 2014-07-01 HISTORY — DX: Elevated prostate specific antigen (PSA): R97.20

## 2014-07-01 HISTORY — DX: Type 2 diabetes mellitus without complications: E11.9

## 2014-07-01 HISTORY — DX: Hyperlipidemia, unspecified: E78.5

## 2014-07-01 HISTORY — DX: Personal history of other diseases of the digestive system: Z87.19

## 2014-07-01 HISTORY — DX: Presence of spectacles and contact lenses: Z97.3

## 2014-07-01 HISTORY — PX: PROSTATE BIOPSY: SHX241

## 2014-07-01 HISTORY — DX: Personal history of other specified conditions: Z87.898

## 2014-07-01 HISTORY — DX: Frequency of micturition: R35.0

## 2014-07-01 HISTORY — DX: Nocturia: R35.1

## 2014-07-01 HISTORY — DX: Shortness of breath: R06.02

## 2014-07-01 HISTORY — DX: Unspecified osteoarthritis, unspecified site: M19.90

## 2014-07-01 LAB — POCT I-STAT, CHEM 8
BUN: 13 mg/dL (ref 6–23)
CREATININE: 1 mg/dL (ref 0.50–1.35)
Calcium, Ion: 1.27 mmol/L (ref 1.13–1.30)
Chloride: 104 mmol/L (ref 96–112)
Glucose, Bld: 126 mg/dL — ABNORMAL HIGH (ref 70–99)
HEMATOCRIT: 42 % (ref 39.0–52.0)
HEMOGLOBIN: 14.3 g/dL (ref 13.0–17.0)
Potassium: 3.9 mmol/L (ref 3.5–5.1)
SODIUM: 142 mmol/L (ref 135–145)
TCO2: 23 mmol/L (ref 0–100)

## 2014-07-01 LAB — GLUCOSE, CAPILLARY: GLUCOSE-CAPILLARY: 126 mg/dL — AB (ref 70–99)

## 2014-07-01 SURGERY — BIOPSY, PROSTATE, RECTAL APPROACH, WITH US GUIDANCE
Anesthesia: General | Site: Prostate

## 2014-07-01 MED ORDER — HYDROMORPHONE HCL 1 MG/ML IJ SOLN
0.2500 mg | INTRAMUSCULAR | Status: DC | PRN
  Filled 2014-07-01: qty 1

## 2014-07-01 MED ORDER — ONDANSETRON HCL 4 MG/2ML IJ SOLN
INTRAMUSCULAR | Status: DC | PRN
  Administered 2014-07-01: 4 mg via INTRAVENOUS

## 2014-07-01 MED ORDER — DEXTROSE 5 % IV SOLN
450.0000 mg | Freq: Once | INTRAVENOUS | Status: AC
  Administered 2014-07-01: 450 mg via INTRAVENOUS
  Filled 2014-07-01: qty 11.25

## 2014-07-01 MED ORDER — FENTANYL CITRATE 0.05 MG/ML IJ SOLN
INTRAMUSCULAR | Status: AC
  Filled 2014-07-01: qty 6

## 2014-07-01 MED ORDER — LACTATED RINGERS IV SOLN
INTRAVENOUS | Status: DC
  Administered 2014-07-01 (×2): via INTRAVENOUS
  Filled 2014-07-01: qty 1000

## 2014-07-01 MED ORDER — PROPOFOL 10 MG/ML IV BOLUS
INTRAVENOUS | Status: DC | PRN
  Administered 2014-07-01: 200 mg via INTRAVENOUS

## 2014-07-01 MED ORDER — MIDAZOLAM HCL 2 MG/2ML IJ SOLN
INTRAMUSCULAR | Status: AC
  Filled 2014-07-01: qty 2

## 2014-07-01 MED ORDER — DEXAMETHASONE SODIUM PHOSPHATE 4 MG/ML IJ SOLN
INTRAMUSCULAR | Status: DC | PRN
  Administered 2014-07-01: 4 mg via INTRAVENOUS

## 2014-07-01 MED ORDER — GENTAMICIN IN SALINE 1-0.9 MG/ML-% IV SOLN
100.0000 mg | INTRAVENOUS | Status: DC
  Filled 2014-07-01: qty 100

## 2014-07-01 MED ORDER — EPHEDRINE SULFATE 50 MG/ML IJ SOLN
INTRAMUSCULAR | Status: DC | PRN
  Administered 2014-07-01: 10 mg via INTRAVENOUS

## 2014-07-01 MED ORDER — ONDANSETRON HCL 4 MG/2ML IJ SOLN
4.0000 mg | Freq: Once | INTRAMUSCULAR | Status: DC | PRN
  Filled 2014-07-01: qty 2

## 2014-07-01 MED ORDER — MIDAZOLAM HCL 5 MG/5ML IJ SOLN
INTRAMUSCULAR | Status: DC | PRN
  Administered 2014-07-01: 2 mg via INTRAVENOUS

## 2014-07-01 MED ORDER — LIDOCAINE HCL (CARDIAC) 20 MG/ML IV SOLN
INTRAVENOUS | Status: DC | PRN
  Administered 2014-07-01: 60 mg via INTRAVENOUS

## 2014-07-01 MED ORDER — FENTANYL CITRATE 0.05 MG/ML IJ SOLN
INTRAMUSCULAR | Status: DC | PRN
  Administered 2014-07-01: 25 ug via INTRAVENOUS
  Administered 2014-07-01: 50 ug via INTRAVENOUS

## 2014-07-01 SURGICAL SUPPLY — 20 items
BAG DRN ANRFLXCHMBR STRAP LEK (BAG)
BAG URINE LEG 19OZ MD ST LTX (BAG) IMPLANT
CATH FOLEY 2WAY SLVR  5CC 16FR (CATHETERS)
CATH FOLEY 2WAY SLVR 5CC 16FR (CATHETERS) IMPLANT
DRESSING TELFA 8X3 (GAUZE/BANDAGES/DRESSINGS) ×1 IMPLANT
DRSG TEGADERM 4X4.75 (GAUZE/BANDAGES/DRESSINGS) IMPLANT
DRSG TEGADERM 8X12 (GAUZE/BANDAGES/DRESSINGS) IMPLANT
GAUZE SPONGE 4X4 12PLY STRL LF (GAUZE/BANDAGES/DRESSINGS) IMPLANT
GLOVE BIO SURGEON STRL SZ7 (GLOVE) ×3 IMPLANT
NDL SAFETY ECLIPSE 18X1.5 (NEEDLE) IMPLANT
NDL SPNL 22GX7 QUINCKE BK (NEEDLE) IMPLANT
NEEDLE HYPO 18GX1.5 SHARP (NEEDLE)
NEEDLE SPNL 22GX7 QUINCKE BK (NEEDLE) IMPLANT
PLUG CATH AND CAP STER (CATHETERS) IMPLANT
SET IRRIG Y TYPE TUR BLADDER L (SET/KITS/TRAYS/PACK) IMPLANT
SURGILUBE 2OZ TUBE FLIPTOP (MISCELLANEOUS) ×3 IMPLANT
SYR 20CC LL (SYRINGE) IMPLANT
SYRINGE 10CC LL (SYRINGE) IMPLANT
TOWEL OR 17X24 6PK STRL BLUE (TOWEL DISPOSABLE) ×1 IMPLANT
UNDERPAD 30X30 INCONTINENT (UNDERPADS AND DIAPERS) ×3 IMPLANT

## 2014-07-01 NOTE — Discharge Instructions (Signed)
°  Post Anesthesia Home Care Instructions  Activity: Get plenty of rest for the remainder of the day. A responsible adult should stay with you for 24 hours following the procedure.  For the next 24 hours, DO NOT: -Drive a car -Paediatric nurse -Drink alcoholic beverages -Take any medication unless instructed by your physician -Make any legal decisions or sign important papers.  Meals: Start with liquid foods such as gelatin or soup. Progress to regular foods as tolerated. Avoid greasy, spicy, heavy foods. If nausea and/or vomiting occur, drink only clear liquids until the nausea and/or vomiting subsides. Call your physician if vomiting continues.  Special Instructions/Symptoms: Your throat may feel dry or sore from the anesthesia or the breathing tube placed in your throat during surgery. If this causes discomfort, gargle with warm salt water. The discomfort should disappear within 24 hours.  Call your surgeon if you experience:   1.  Fever over 101.0. 2.  Inability to urinate. 3.  Nausea and/or vomiting. 4.  Extreme swelling or bruising at the surgical site. 5.  Continued bleeding from the incision. 6.  Increased pain, redness or drainage from the incision. 7.  Problems related to your pain medication. 8. Any problems and/or concerns

## 2014-07-01 NOTE — Op Note (Signed)
[name] is a 64 y.o.   07/01/2014  General  Preop diagnosis: Elevated PSA. Abnormal MRI prostate  Postop diagnosis: Same  Procedure done: Ultrasound guided prostate biopsy  Surgeon: Charlene Brooke. Chailyn Racette  X-ray technologist: Ringgold Lions  Anesthesia: Gen.  Indication: Patient is a 64 years old male who has a history of elevated PSA with 2 negative prostate biopsies in 2008 and 2012. His PSA is now 12.1. MRI prostate showed a 1 cm focus of restricted diffusion at the left mid peripheral zone. He is scheduled for repeat prostate biopsy. The procedure, the risks, the benefits were discussed with the patient and his wife. The risks include but are not limited to hemorrhage, infection, sepsis. They understand and are agreeable  Procedure: Patient was identified by his wrist band and proper timeout was taken.  Under general anesthesia the patient was prepped and draped and placed in the left lateral decubitus position. The transducer was inserted in the rectum. The seminal vesicles appear normal. The prostate measures 5.12 cm in width, 5.12 cm in height and 6.45 cm in length. The prostate volume is 88.39 mL. There are some calcifications at the apex of the prostate. There is no definite evidence of hypoechoic nodules.  Under ultrasound guidance 2 biopsies of the right lateral base, 2 biopsies of the right medial base were done. Then 2 biopsies of the right mid lateral gland and 2 biopsies of the right mid medial gland were done. 2 biopsies of the right lateral apex and 2 biopsies of the right medial apex were taken.  Because the MRI showed abnormal findings at the left mid peripheral gland I took more samples on the left side.  Three biopsies of the left lateral base  and 3 biopsies of the left medial base were taken. I took 3 biopsies of the left mid lateral and 3 biopsies of the left mid medial gland. Then 3 biopsies of the left lateral apex and 3 biopsies of the left medial apex were also  taken.  There was no evidence of bleeding at the end of the procedure.  Patient tolerated the procedure well and left the OR in satisfactory condition to postanesthesia care unit.  EBL: Minimal  CC: Wenda Low, MD

## 2014-07-01 NOTE — Transfer of Care (Signed)
Immediate Anesthesia Transfer of Care Note  Patient: [name]  Procedure(s) Performed: Procedure(s) (LRB): BIOPSY TRANSRECTAL ULTRASONIC PROSTATE (TUBP) (N/A)  Patient Location: PACU  Anesthesia Type: General  Level of Consciousness: awake, oriented, sedated and patient cooperative  Airway & Oxygen Therapy: Patient Spontanous Breathing and Patient connected to face mask oxygen  Post-op Assessment: Report given to PACU RN and Post -op Vital signs reviewed and stable  Post vital signs: Reviewed and stable  Complications: No apparent anesthesia complications

## 2014-07-01 NOTE — Anesthesia Preprocedure Evaluation (Signed)
Anesthesia Evaluation  Patient identified by MRN, date of birth, ID band Patient awake    Reviewed: Allergy & Precautions, NPO status , Patient's Chart, lab work & pertinent test results  Airway        Dental   Pulmonary sleep apnea ,          Cardiovascular hypertension,     Neuro/Psych    GI/Hepatic   Endo/Other  diabetes, Type 2, Oral Hypoglycemic Agents  Renal/GU      Musculoskeletal  (+) Arthritis -,   Abdominal   Peds  Hematology   Anesthesia Other Findings   Reproductive/Obstetrics                             Anesthesia Physical Anesthesia Plan  ASA: III  Anesthesia Plan: General   Post-op Pain Management:    Induction: Intravenous  Airway Management Planned: LMA and Oral ETT  Additional Equipment:   Intra-op Plan:   Post-operative Plan: Extubation in OR  Informed Consent: I have reviewed the patients History and Physical, chart, labs and discussed the procedure including the risks, benefits and alternatives for the proposed anesthesia with the patient or authorized representative who has indicated his/her understanding and acceptance.     Plan Discussed with: CRNA, Anesthesiologist and Surgeon  Anesthesia Plan Comments:         Anesthesia Quick Evaluation

## 2014-07-01 NOTE — H&P (Signed)
History of Present Illness Mr Ayala has a history of elevated PSA with 2 negative prostate biopsies in 2008 and 2012. PSA has remained elevated. It is now 12.1. MRI prostate showed a 1 cm focus of restricted diffusion at the left mid peripheral zone. He needs repeat prostate biopsy with special emphasis on the left mid peripheral zone. This was discussed in detail with the patient and his wife. The risks include but are not limited to hemorrhage: blood in the urine, stools, semen, infection, sepsis. They understand and wish to proceed.   Past Medical History Problems  1. History of Anxiety (F41.9) 2. History of Arthritis 3. History of depression (Z86.59) 4. History of esophageal reflux (Z87.19) 5. History of hepatitis (Z86.19) 6. History of hypercholesterolemia (Z86.39) 7. History of sleep apnea (Z87.09)  Surgical History Problems  1. History of Appendectomy 2. History of Biopsy Bone Marrow 3. History of Biopsy Of The Prostate Needle 4. 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 History of Complete Colonoscopy 6. History of Surgery Of Male Genitalia Vasectomy  Current Meds 1. Aspirin 81 MG Oral Tablet;  Therapy: (Recorded:09Apr2012) to Recorded 2. Diazepam 10 MG Oral Tablet (Valium); Take tablet 1 hour prior to procedure;  Therapy: [id number] to (Last Rx:15Jan2016) Ordered 3. Fish Oil CAPS;  Therapy: (Recorded:08Apr2008) to Recorded 4. Ibuprofen CAPS;  Therapy: (Recorded:08Apr2008) to Recorded 5. Januvia TABS;  Therapy: (Recorded:09Apr2012) to Recorded 6. MetFORMIN HCl - 500 MG Oral Tablet;  Therapy: (Recorded:09Apr2012) to Recorded 7. PROzac 20 MG/5ML SOLN (FLUoxetine HCl);  Therapy: (Recorded:08Apr2008) to Recorded 8. Ramipril 10 MG Oral Capsule;  Therapy: (Recorded:09Apr2012) to Recorded 9. Zocor TABS (Simvastatin);  Therapy: (Recorded:09Apr2012) to Recorded  Allergies Medication  1. Morphine Derivatives  Family History Problems  1.  Family history of Alzheimer's Disease : Mother 2. Family history of Diabetes Mellitus 3. Family history of Family Health Status Number Of Children   2 sons 4. Family history of Lung Cancer : Sister  Social History Problems  1. Denied: History of Alcohol Use 2. Caffeine Use 3. Family history of Death In The Family Mother   54 4. Marital History - Currently Married 5. Never A Smoker 6. Occupation:   Quarry manager 7. Denied: History of Tobacco Use  Review of Systems Genitourinary, constitutional, skin, eye, otolaryngeal, hematologic/lymphatic, cardiovascular, pulmonary, endocrine, musculoskeletal, gastrointestinal, neurological and psychiatric system(s) were reviewed and pertinent findings if present are noted and are otherwise negative.  Genitourinary: nocturia.    Physical Exam Constitutional: Well nourished and well developed . No acute distress.  ENT:. The ears and nose are normal in appearance.  Neck: The appearance of the neck is normal and no neck mass is present.  Pulmonary: No respiratory distress and normal respiratory rhythm and effort.  Cardiovascular: Heart rate and rhythm are normal . No peripheral edema.  Abdomen: The abdomen is soft and nontender. No masses are palpated. No CVA tenderness. No hernias are palpable. No hepatosplenomegaly noted.  Rectal: Rectal exam demonstrates normal sphincter tone, no tenderness and no masses. Estimated prostate size is 2+. The prostate has no nodularity and is not tender. The left seminal vesicle is nonpalpable. The right seminal vesicle is nonpalpable. The perineum is normal on inspection.  Genitourinary: Examination of the penis demonstrates no discharge, no masses, no lesions and a normal meatus. The scrotum is without lesions. The right epididymis is palpably normal and non-tender. The left epididymis is palpably normal and non-tender. The right testis is non-tender and without masses. The left testis is non-tender  and without  masses.  Lymphatics: The femoral and inguinal nodes are not enlarged or tender.  Skin: Normal skin turgor, no visible rash and no visible skin lesions.  Neuro/Psych:. Mood and affect are appropriate.    Results/Data Urine [Data Includes: Last 1 Day]   [id number]  COLOR YELLOW   APPEARANCE CLEAR   SPECIFIC GRAVITY 1.015   pH 5.5   GLUCOSE NEG mg/dL  BILIRUBIN NEG   KETONE NEG mg/dL  BLOOD TRACE   PROTEIN NEG mg/dL  UROBILINOGEN 0.2 mg/dL  NITRITE NEG   LEUKOCYTE ESTERASE NEG   SQUAMOUS EPITHELIAL/HPF NONE SEEN   WBC 0-2 WBC/hpf  RBC 0-2 RBC/hpf  BACTERIA NONE SEEN   CRYSTALS NONE SEEN   CASTS NONE SEEN    Assessment Assessed  1. Elevated prostate specific antigen (PSA) (R97.2) 2. Benign localized prostatic hyperplasia with lower urinary tract symptoms (LUTS) (N40.1) 3. Nocturia (R35.1)  Plan Health Maintenance  1. UA With REFLEX; [Do Not Release]; Status:Complete;   Done: [id number] 01:24PM  Ultrasound guided prostate biopsy. Patient requests general anesthesia.

## 2014-07-01 NOTE — Anesthesia Procedure Notes (Signed)
Procedure Name: LMA Insertion Date/Time: 07/01/2014 9:02 AM Performed by: Denna Haggard D Pre-anesthesia Checklist: Patient identified, Emergency Drugs available, Suction available and Patient being monitored Patient Re-evaluated:Patient Re-evaluated prior to inductionOxygen Delivery Method: Circle System Utilized Preoxygenation: Pre-oxygenation with 100% oxygen Intubation Type: IV induction Ventilation: Mask ventilation without difficulty LMA: LMA inserted LMA Size: 5.0 Number of attempts: 1 Airway Equipment and Method: Bite block Placement Confirmation: positive ETCO2 Tube secured with: Tape Dental Injury: Teeth and Oropharynx as per pre-operative assessment

## 2014-07-01 NOTE — Anesthesia Postprocedure Evaluation (Signed)
  Anesthesia Post-op Note  Patient: [name]  Procedure(s) Performed: Procedure(s): BIOPSY TRANSRECTAL ULTRASONIC PROSTATE (TUBP) (N/A)  Patient Location: PACU  Anesthesia Type:General  Level of Consciousness: awake, alert , oriented and patient cooperative  Airway and Oxygen Therapy: Patient Spontanous Breathing  Post-op Pain: mild  Post-op Assessment: Post-op Vital signs reviewed, Patient's Cardiovascular Status Stable, Respiratory Function Stable, Patent Airway, No signs of Nausea or vomiting and Pain level controlled  Post-op Vital Signs: stable  Last Vitals:  Filed Vitals:   07/01/14 0935  BP: 162/85  Pulse: 73  Temp: 36.7 C  Resp: 16    Complications: No apparent anesthesia complications

## 2014-07-02 ENCOUNTER — Encounter (HOSPITAL_BASED_OUTPATIENT_CLINIC_OR_DEPARTMENT_OTHER): Payer: Self-pay | Admitting: Urology

## 2014-07-09 ENCOUNTER — Other Ambulatory Visit (HOSPITAL_COMMUNITY): Payer: Self-pay | Admitting: Urology

## 2014-07-09 DIAGNOSIS — C61 Malignant neoplasm of prostate: Secondary | ICD-10-CM

## 2014-07-14 ENCOUNTER — Encounter (HOSPITAL_COMMUNITY)
Admission: RE | Admit: 2014-07-14 | Discharge: 2014-07-14 | Disposition: A | Discharge status: Home / Self Care | Payer: 59 | Source: Ambulatory Visit | Attending: Urology | Admitting: Urology

## 2014-07-14 DIAGNOSIS — C61 Malignant neoplasm of prostate: Secondary | ICD-10-CM | POA: Diagnosis present

## 2014-07-14 MED ORDER — TECHNETIUM TC 99M MEDRONATE IV KIT
25.0000 | PACK | Freq: Once | INTRAVENOUS | Status: AC | PRN
  Administered 2014-07-14: 25 via INTRAVENOUS

## 2014-07-18 ENCOUNTER — Encounter (HOSPITAL_COMMUNITY): Payer: 59

## 2014-07-22 ENCOUNTER — Other Ambulatory Visit: Payer: Self-pay | Admitting: Urology

## 2014-07-28 NOTE — Patient Instructions (Addendum)
[name]  07/28/2014   Your procedure is scheduled on:   08-11-2014 Monday  Enter through [facility]  Entrance and follow signs to [facility]. Arrive at    [place]    AM.  Call this number if you have problems the morning of surgery: [phone]  Or Presurgical Testing [phone].   For Living Will and/or Health Care Power Attorney Forms: please provide copy for your medical record,may bring AM of surgery(Forms should be already notarized -we do not provide this service).(07-30-14  No information preferred today).  Remember: Follow any bowel prep instructions per MD office. For Cpap use: Bring mask and tubing only.   Do not eat food/ or drink: After Midnight.     Take these medicines the morning of surgery with A SIP OF WATER: NONE> Take no diabetic meds AM of surgery.   Do not wear jewelry, make-up or nail polish.  Do not wear deodorant, lotions, powders, or perfumes.   Do not shave legs and under arms- 48 hours(2 days) prior to first CHG shower.(Shaving face and neck okay.)  Do not bring valuables to the hospital.(Hospital is not responsible for lost valuables).  Contacts, dentures or removable bridgework, body piercing, hair pins may not be worn into surgery.  Leave suitcase in the car. After surgery it may be brought to your room.  For patients admitted to the hospital, checkout time is 11:00 AM the day of discharge.(Restricted visitors-Any Persons displaying flu-like symptoms or illness).    Patients discharged the day of surgery will not be allowed to drive home. Must have responsible person with you x 24 hours once discharged.  Name and phone number of your driver: [name] -spouse [phone] cell     Please read over the following fact sheets that you were given:  CHG(Chlorhexidine Gluconate 4% Surgical Soap) use,  Blood Transfusion fact sheet, Incentive Spirometry Instruction.  Remember : Type/Screen "Blue armbands" - may not be removed once applied(would  result in being retested AM of surgery, if removed).         [place] - Preparing for Surgery Before surgery, you can play an important role.  Because skin is not sterile, your skin needs to be as free of germs as possible.  You can reduce the number of germs on your skin by washing with CHG (chlorahexidine gluconate) soap before surgery.  CHG is an antiseptic cleaner which kills germs and bonds with the skin to continue killing germs even after washing. Please DO NOT use if you have an allergy to CHG or antibacterial soaps.  If your skin becomes reddened/irritated stop using the CHG and inform your nurse when you arrive at Short Stay. Do not shave (including legs and underarms) for at least 48 hours prior to the first CHG shower.  You may shave your face/neck. Please follow these instructions carefully:  1.  Shower with CHG Soap the night before surgery and the  morning of Surgery.  2.  If you choose to wash your hair, wash your hair first as usual with your  normal  shampoo.  3.  After you shampoo, rinse your hair and body thoroughly to remove the  shampoo.                           4.  Use CHG as you would any other liquid soap.  You can apply chg directly  to the skin and wash  Gently with a scrungie or clean washcloth.  5.  Apply the CHG Soap to your body ONLY FROM THE NECK DOWN.   Do not use on face/ open                           Wound or open sores. Avoid contact with eyes, ears mouth and genitals (private parts).                       Wash face,  Genitals (private parts) with your normal soap.             6.  Wash thoroughly, paying special attention to the area where your surgery  will be performed.  7.  Thoroughly rinse your body with warm water from the neck down.  8.  DO NOT shower/wash with your normal soap after using and rinsing off  the CHG Soap.                9.  Pat yourself dry with a clean towel.            10.  Wear clean pajamas.            11.   Place clean sheets on your bed the night of your first shower and do not  sleep with pets. Day of Surgery : Do not apply any lotions/deodorants the morning of surgery.  Please wear clean clothes to the hospital/surgery center.  FAILURE TO FOLLOW THESE INSTRUCTIONS MAY RESULT IN THE CANCELLATION OF YOUR SURGERY PATIENT SIGNATURE_________________________________  NURSE SIGNATURE__________________________________  ________________________________________________________________________   [name]  An incentive spirometer is a tool that can help keep your lungs clear and active. This tool measures how well you are filling your lungs with each breath. Taking long deep breaths may help reverse or decrease the chance of developing breathing (pulmonary) problems (especially infection) following:  A long period of time when you are unable to move or be active. BEFORE THE PROCEDURE   If the spirometer includes an indicator to show your best effort, your nurse or respiratory therapist will set it to a desired goal.  If possible, sit up straight or lean slightly forward. Try not to slouch.  Hold the incentive spirometer in an upright position. INSTRUCTIONS FOR USE   Sit on the edge of your bed if possible, or sit up as far as you can in bed or on a chair.  Hold the incentive spirometer in an upright position.  Breathe out normally.  Place the mouthpiece in your mouth and seal your lips tightly around it.  Breathe in slowly and as deeply as possible, raising the piston or the ball toward the top of the column.  Hold your breath for 3-5 seconds or for as long as possible. Allow the piston or ball to fall to the bottom of the column.  Remove the mouthpiece from your mouth and breathe out normally.  Rest for a few seconds and repeat Steps 1 through 7 at least 10 times every 1-2 hours when you are awake. Take your time and take a few normal breaths between deep breaths.  The  spirometer may include an indicator to show your best effort. Use the indicator as a goal to work toward during each repetition.  After each set of 10 deep breaths, practice coughing to be sure your lungs are clear. If you have an incision (the cut made at the time of  surgery), support your incision when coughing by placing a pillow or rolled up towels firmly against it. Once you are able to get out of bed, walk around indoors and cough well. You may stop using the incentive spirometer when instructed by your caregiver.  RISKS AND COMPLICATIONS  Take your time so you do not get dizzy or light-headed.  If you are in pain, you may need to take or ask for pain medication before doing incentive spirometry. It is harder to take a deep breath if you are having pain. AFTER USE  Rest and breathe slowly and easily.  It can be helpful to keep track of a log of your progress. Your caregiver can provide you with a simple table to help with this. If you are using the spirometer at home, follow these instructions: [place] IF:   You are having difficultly using the spirometer.  You have trouble using the spirometer as often as instructed.  Your pain medication is not giving enough relief while using the spirometer.  You develop fever of 100.5 F (38.1 C) or higher. SEEK IMMEDIATE MEDICAL CARE IF:   You cough up bloody sputum that had not been present before.  You develop fever of 102 F (38.9 C) or greater.  You develop worsening pain at or near the incision site. MAKE SURE YOU:   Understand these instructions.  Will watch your condition.  Will get help right away if you are not doing well or get worse. Document Released: 09/05/2006 Document Revised: 07/18/2011 Document Reviewed: 11/06/2006 ExitCare Patient Information 2014 [employer], [place].   ________________________________________________________________________  WHAT IS A BLOOD TRANSFUSION? Blood Transfusion  Information  A transfusion is the replacement of blood or some of its parts. Blood is made up of multiple cells which provide different functions.  Red blood cells carry oxygen and are used for blood loss replacement.  White blood cells fight against infection.  Platelets control bleeding.  Plasma helps clot blood.  Other blood products are available for specialized needs, such as hemophilia or other clotting disorders. BEFORE THE TRANSFUSION  Who gives blood for transfusions?   Healthy volunteers who are fully evaluated to make sure their blood is safe. This is blood bank blood. Transfusion therapy is the safest it has ever been in the practice of medicine. Before blood is taken from a donor, a complete history is taken to make sure that person has no history of diseases nor engages in risky social behavior (examples are intravenous drug use or sexual activity with multiple partners). The donor's travel history is screened to minimize risk of transmitting infections, such as malaria. The donated blood is tested for signs of infectious diseases, such as HIV and hepatitis. The blood is then tested to be sure it is compatible with you in order to minimize the chance of a transfusion reaction. If you or a relative donates blood, this is often done in anticipation of surgery and is not appropriate for emergency situations. It takes many days to process the donated blood. RISKS AND COMPLICATIONS Although transfusion therapy is very safe and saves many lives, the main dangers of transfusion include:   Getting an infectious disease.  Developing a transfusion reaction. This is an allergic reaction to something in the blood you were given. Every precaution is taken to prevent this. The decision to have a blood transfusion has been considered carefully by your caregiver before blood is given. Blood is not given unless the benefits outweigh the risks. AFTER THE TRANSFUSION  Right after receiving a  blood transfusion, you will usually feel much better and more energetic. This is especially true if your red blood cells have gotten low (anemic). The transfusion raises the level of the red blood cells which carry oxygen, and this usually causes an energy increase.  The nurse administering the transfusion will monitor you carefully for complications. HOME CARE INSTRUCTIONS  No special instructions are needed after a transfusion. You may find your energy is better. Speak with your caregiver about any limitations on activity for underlying diseases you may have. SEEK MEDICAL CARE IF:   Your condition is not improving after your transfusion.  You develop redness or irritation at the intravenous (IV) site. SEEK IMMEDIATE MEDICAL CARE IF:  Any of the following symptoms occur over the next 12 hours:  Shaking chills.  You have a temperature by mouth above 102 F (38.9 C), not controlled by medicine.  Chest, back, or muscle pain.  People around you feel you are not acting correctly or are confused.  Shortness of breath or difficulty breathing.  Dizziness and fainting.  You get a rash or develop hives.  You have a decrease in urine output.  Your urine turns a dark color or changes to pink, red, or brown. Any of the following symptoms occur over the next 10 days:  You have a temperature by mouth above 102 F (38.9 C), not controlled by medicine.  Shortness of breath.  Weakness after normal activity.  The white part of the eye turns yellow (jaundice).  You have a decrease in the amount of urine or are urinating less often.  Your urine turns a dark color or changes to pink, red, or brown. Document Released: 04/22/2000 Document Revised: 07/18/2011 Document Reviewed: 12/10/2007 [facility] Patient Information 2014 [place], [place].  _______________________________________________________________________

## 2014-07-30 ENCOUNTER — Encounter (HOSPITAL_COMMUNITY): Payer: Self-pay

## 2014-07-30 ENCOUNTER — Ambulatory Visit (HOSPITAL_COMMUNITY)
Admission: RE | Admit: 2014-07-30 | Discharge: 2014-07-30 | Disposition: A | Discharge status: Home / Self Care | Payer: 59 | Source: Ambulatory Visit | Attending: Urology | Admitting: Urology

## 2014-07-30 ENCOUNTER — Encounter (HOSPITAL_COMMUNITY)
Admission: RE | Admit: 2014-07-30 | Discharge: 2014-07-30 | Disposition: A | Discharge status: Home / Self Care | Payer: 59 | Source: Ambulatory Visit | Attending: Urology | Admitting: Urology

## 2014-07-30 DIAGNOSIS — C61 Malignant neoplasm of prostate: Secondary | ICD-10-CM | POA: Diagnosis not present

## 2014-07-30 DIAGNOSIS — Z01818 Encounter for other preprocedural examination: Secondary | ICD-10-CM

## 2014-07-30 DIAGNOSIS — Z01810 Encounter for preprocedural cardiovascular examination: Secondary | ICD-10-CM | POA: Insufficient documentation

## 2014-07-30 HISTORY — DX: Unspecified hemorrhoids: K64.9

## 2014-07-30 HISTORY — DX: Unspecified hearing loss, unspecified ear: H91.90

## 2014-07-30 HISTORY — DX: Myoneural disorder, unspecified: G70.9

## 2014-07-30 HISTORY — DX: Malignant (primary) neoplasm, unspecified: C80.1

## 2014-07-30 LAB — BASIC METABOLIC PANEL
ANION GAP: 10 (ref 5–15)
BUN: 15 mg/dL (ref 6–23)
CALCIUM: 9.2 mg/dL (ref 8.4–10.5)
CO2: 24 mmol/L (ref 19–32)
Chloride: 107 mmol/L (ref 96–112)
Creatinine, Ser: 1.09 mg/dL (ref 0.50–1.35)
GFR calc Af Amer: 81 mL/min — ABNORMAL LOW (ref >90)
GFR, EST NON AFRICAN AMERICAN: 70 mL/min — AB (ref >90)
Glucose, Bld: 191 mg/dL — ABNORMAL HIGH (ref 70–99)
Potassium: 3.8 mmol/L (ref 3.5–5.1)
SODIUM: 141 mmol/L (ref 135–145)

## 2014-07-30 LAB — CBC
HCT: 39.8 % (ref 39.0–52.0)
Hemoglobin: 13.4 g/dL (ref 13.0–17.0)
MCH: 31.5 pg (ref 26.0–34.0)
MCHC: 33.7 g/dL (ref 30.0–36.0)
MCV: 93.4 fL (ref 78.0–100.0)
PLATELETS: 278 10*3/uL (ref 150–400)
RBC: 4.26 MIL/uL (ref 4.22–5.81)
RDW: 13.5 % (ref 11.5–15.5)
WBC: 10.2 10*3/uL (ref 4.0–10.5)

## 2014-07-30 NOTE — Pre-Procedure Instructions (Signed)
EKG 2'16 . CXR done today -MD order.

## 2014-08-08 NOTE — H&P (Signed)
Chief Complaint Prostate cancer   History of Present Illness [name] is a pleasant 64 year old gentleman seen today at the request of Dr. Lowella Bandy in consultation for consideration of surgical treatment for his recently diagnosed prostate cancer. He has a long-standing history of an elevated PSA dating back to 2008 when he underwent his initial prostate biopsy that was found to be negative for malignancy but did demonstrate acute and chronic inflammation. His PSA persistently rose causing him to undergo a second prostate biopsy in April 2012 that was also negative for malignancy but again demonstrated inflammation. A PCA 3 test in October 2012 was 20 indicating a lower risk of prostate cancer. He was more recently referred back to Dr. Janice Norrie for further evaluation of his elevated PSA which was most recently found to be 12.1. This was not significantly increased from his prior baseline PSAs. However, due to persistent concern about prostate cancer and his persistently elevated PSA, he underwent an MRI of the prostate on 06/10/14 which demonstrated a concerning 1 cm lesion in the left mid prostate with restricted diffusion concerning for possible high-risk disease. He therefore underwent a prostate needle biopsy on 07/01/14 with extensive sampling of the prostate and particular attention to the left mid gland. A total of 30 biopsy cores were obtained with extensive acute and chronic inflammation noted. However, 2 cores were noted to be positive at the right lateral base. This demonstrated Gleason 4+3 = 7 adenocarcinoma in less than 5% of that tissue. He has no family history of prostate cancer.    ** His medical comorbidities include diabetes, hypertension, dyslipidemia, obstructive sleep apnea managed with CPAP, and esophageal reflux disease.    TNM stage: cT1c Nx Mx  PSA: 12.1  Gleason score: 4+3 = 7  Biopsy (07/01/14): 2/30 cores positive - extensive acute and chronic inflammation, 2 out of 2  cores positive at the right lateral base (< 5%, 4+3=7)  Prostate volume: 88.4 cc    Urinary function: He does have significant lower urinary tract symptoms including frequency, intermittency, urgency, and weak stream. IPSS is 19/3. He is not currently on treatment.  Erectile function: He does have moderate erectile dysfunction although has not required treatment. He estimates that he could obtain an erection approximately 8 out of 10 times satisfactorily. SHIM score is 18.   Past Medical History Problems  1. History of Anxiety (F41.9) 2. History of Arthritis 3. History of depression (Z86.59) 4. History of diabetes mellitus (Z86.39) 5. History of esophageal reflux (Z87.19) 6. History of hypercholesterolemia (Z86.39) 7. History of sleep apnea (Z87.09)  Surgical History Problems  1. History of Appendectomy 2. History of Biopsy Bone Marrow 3. History of Biopsy Of The Prostate Needle 4. History of Biopsy Of The Prostate Needle 5. History of Bone Marrow Transplant Allogeneic Donor Lymphocyte Infusions 6. History of Complete Colonoscopy 7. History of Surgery Of Male Genitalia Vasectomy  Current Meds 1. Aspirin 81 MG Oral Tablet;  Therapy: (Recorded:09Apr2012) to Recorded 2. Diazepam 10 MG Oral Tablet (Valium); Take tablet 1 hour prior to procedure;  Therapy: [id number] to (Last Rx:15Jan2016) Ordered 3. Fish Oil CAPS;  Therapy: (Recorded:08Apr2008) to Recorded 4. Ibuprofen CAPS;  Therapy: (Recorded:08Apr2008) to Recorded 5. Januvia TABS;  Therapy: (Recorded:09Apr2012) to Recorded 6. MetFORMIN HCl - 500 MG Oral Tablet;  Therapy: (Recorded:09Apr2012) to Recorded 7. PROzac 20 MG/5ML SOLN (FLUoxetine HCl);  Therapy: (Recorded:08Apr2008) to Recorded 8. Ramipril 10 MG Oral Capsule;  Therapy: (Recorded:09Apr2012) to Recorded 9. Zocor TABS (Simvastatin);  Therapy: (Recorded:09Apr2012) to Recorded  Allergies Medication  1. Morphine Derivatives  Family History Problems  1. Family  history of Alzheimer's Disease : Mother 2. Family history of Diabetes Mellitus 3. Family history of leukemia (Z80.6) : Sister 4. Family history of Lung Cancer : Mother  Social History Problems    Denied: History of Alcohol Use   Family history of Death In The Family Mother   70   Marital History - Currently Married   Never A Smoker   Occupation:   Quarry manager   Denied: History of Tobacco Use  Review of Systems Genitourinary, constitutional, skin, eye, otolaryngeal, hematologic/lymphatic, cardiovascular, pulmonary, endocrine, musculoskeletal, gastrointestinal, neurological and psychiatric system(s) were reviewed and pertinent findings if present are noted and are otherwise negative.    Vitals Vital Signs [Data Includes: Last 1 Day]  Recorded: [id number] 11:18AM  Blood Pressure: 178 / 95 Heart Rate: 75  Physical Exam Constitutional: Well nourished and well developed . No acute distress.  ENT:. The ears and nose are normal in appearance.  Neck: The appearance of the neck is normal and no neck mass is present.  Pulmonary: No respiratory distress, normal respiratory rhythm and effort and clear bilateral breath sounds.  Cardiovascular: Heart rate and rhythm are normal . No peripheral edema.  Abdomen: The abdomen is mildly obese. The abdomen is soft and nontender. No masses are palpated. No CVA tenderness. No hepatosplenomegaly noted. He has a large reducible umbilical hernia. This is somewhat painful on palpation. We discussed this further and he has been intermittently symptomatic.  Rectal: Rectal exam demonstrates normal sphincter tone, no tenderness and no masses. Prostate size is estimated to be 60 g. Normal rectal tone, no rectal masses, prostate is smooth, symmetric and non-tender. The prostate has no nodularity and is not tender. The left seminal vesicle is nonpalpable. The right seminal vesicle is nonpalpable. The perineum is normal on inspection.  Lymphatics: The femoral  and inguinal nodes are not enlarged or tender.  Skin: Normal skin turgor, no visible rash and no visible skin lesions.  Neuro/Psych:. Mood and affect are appropriate.    Results/Data Urine [Data Includes: Last 1 Day]   [id number]  COLOR YELLOW   APPEARANCE CLEAR   SPECIFIC GRAVITY 1.020   pH 5.5   GLUCOSE NEG mg/dL  BILIRUBIN NEG   KETONE NEG mg/dL  BLOOD TRACE   PROTEIN NEG mg/dL  UROBILINOGEN 0.2 mg/dL  NITRITE NEG   LEUKOCYTE ESTERASE NEG   SQUAMOUS EPITHELIAL/HPF RARE   WBC 0-2 WBC/hpf  RBC 0-2 RBC/hpf  BACTERIA RARE   CRYSTALS NONE SEEN   CASTS NONE SEEN    I independently reviewed his medical records, PSA results, and pathology reports. Findings are as dictated above. I independently reviewed his MRI and bone scan results. Findings are negative for metastatic disease.   Assessment Assessed  1. Adenocarcinoma of prostate (C61)  Plan Adenocarcinoma of prostate  1. Follow-up Schedule Surgery Office  Follow-up  Status: Complete  Done: [id number] 2. PT/OT Referral Referral  Referral  Status: Hold For - PreCert,Date of Service,Physical  Therapy  Requested for: 23Mar2016 Health Maintenance  3. UA With REFLEX; [Do Not Release]; Status:Complete;   Done: [id number] 11:09AM  Discussion/Summary 1. Prostate cancer: [name] adamantly wishes to proceed with surgical treatment of his prostate cancer. He also has a symptomatic umbilical hernia and is interested in having this repaired as well.   The patient was counseled about the natural history of prostate cancer and the standard treatment options that are available for prostate cancer. It  was explained to him how his age and life expectancy, clinical stage, Gleason score, and PSA affect his prognosis, the decision to proceed with additional staging studies, as well as how that information influences recommended treatment strategies. We discussed the roles for active surveillance, radiation therapy, surgical therapy, androgen  deprivation, as well as ablative therapy options for the treatment of prostate cancer as appropriate to his individual cancer situation. We discussed the risks and benefits of these options with regard to their impact on cancer control and also in terms of potential adverse events, complications, and impact on quiality of life particularly related to urinary, bowel, and sexual function. The patient was encouraged to ask questions throughout the discussion today and all questions were answered to his stated satisfaction. In addition, the patient was provided with and/or directed to appropriate resources and literature for further education about prostate cancer and treatment options.   We discussed surgical therapy for prostate cancer including the different available surgical approaches. We discussed, in detail, the risks and expectations of surgery with regard to cancer control, urinary control, and erectile function as well as the expected postoperative recovery process. Additional risks of surgery including but not limited to bleeding, infection, hernia formation, nerve damage, lymphocele formation, bowel/rectal injury potentially necessitating colostomy, damage to the urinary tract resulting in urine leakage, urethral stricture, and the cardiopulmonary risks such as myocardial infarction, stroke, death, venothromboembolism, etc. were explained. The risk of open surgical conversion for robotic/laparoscopic prostatectomy was also discussed.     He will be scheduled for a bilateral nerve sparing robot-assisted laparoscopic radical prostatectomy and bilateral pelvic lymphadenectomy and primary repair of his umbilical hernia. With respect to his hernia, he understands the significant risks for possible hernia recurrence with a primary repair versus mesh repair.     Cc: Dr. Lowella Bandy  Dr. Benita Stabile        A total of 65 minutes were spent in the overall care of the patient today with 55 minutes  in direct face to face consultation.    Signatures Electronically signed by : Raynelle Bring, M.D.; Jul 22 2014  1:14PM EST

## 2014-08-10 NOTE — Anesthesia Preprocedure Evaluation (Addendum)
Anesthesia Evaluation  Patient identified by MRN, date of birth, ID band Patient awake    Reviewed: Allergy & Precautions, H&P , NPO status , Patient's Chart, lab work & pertinent test results  Airway Mallampati: III  TM Distance: >3 FB Neck ROM: full    Dental no notable dental hx. (+) Teeth Intact, Dental Advisory Given   Pulmonary shortness of breath and with exertion, sleep apnea and Continuous Positive Airway Pressure Ventilation ,  breath sounds clear to auscultation  Pulmonary exam normal       Cardiovascular Exercise Tolerance: Good hypertension, Pt. on medications negative cardio ROS  Rhythm:regular Rate:Normal     Neuro/Psych negative neurological ROS  negative psych ROS   GI/Hepatic negative GI ROS, Neg liver ROS,   Endo/Other  diabetes, Well Controlled, Type 2, Oral Hypoglycemic Agents  Renal/GU negative Renal ROS  negative genitourinary   Musculoskeletal   Abdominal   Peds  Hematology negative hematology ROS (+)   Anesthesia Other Findings   Reproductive/Obstetrics negative OB ROS                            Anesthesia Physical Anesthesia Plan  ASA: III  Anesthesia Plan: General   Post-op Pain Management:    Induction: Intravenous  Airway Management Planned: Oral ETT  Additional Equipment:   Intra-op Plan:   Post-operative Plan: Extubation in OR  Informed Consent: I have reviewed the patients History and Physical, chart, labs and discussed the procedure including the risks, benefits and alternatives for the proposed anesthesia with the patient or authorized representative who has indicated his/her understanding and acceptance.   Dental Advisory Given  Plan Discussed with: CRNA and Surgeon  Anesthesia Plan Comments:         Anesthesia Quick Evaluation

## 2014-08-11 ENCOUNTER — Encounter (HOSPITAL_COMMUNITY)
Admission: RE | Disposition: A | Discharge status: Home / Self Care | Payer: Self-pay | Source: Ambulatory Visit | Attending: Urology

## 2014-08-11 ENCOUNTER — Inpatient Hospital Stay (HOSPITAL_COMMUNITY): Payer: 59 | Admitting: Anesthesiology

## 2014-08-11 ENCOUNTER — Encounter (HOSPITAL_COMMUNITY): Payer: Self-pay | Admitting: *Deleted

## 2014-08-11 ENCOUNTER — Inpatient Hospital Stay (HOSPITAL_COMMUNITY)
Admission: RE | Admit: 2014-08-11 | Discharge: 2014-08-12 | DRG: 708 | Disposition: A | Discharge status: Home / Self Care | Payer: 59 | Source: Ambulatory Visit | Attending: Urology | Admitting: Urology

## 2014-08-11 DIAGNOSIS — N529 Male erectile dysfunction, unspecified: Secondary | ICD-10-CM | POA: Diagnosis present

## 2014-08-11 DIAGNOSIS — E785 Hyperlipidemia, unspecified: Secondary | ICD-10-CM | POA: Diagnosis present

## 2014-08-11 DIAGNOSIS — E119 Type 2 diabetes mellitus without complications: Secondary | ICD-10-CM | POA: Diagnosis present

## 2014-08-11 DIAGNOSIS — Z7982 Long term (current) use of aspirin: Secondary | ICD-10-CM | POA: Diagnosis not present

## 2014-08-11 DIAGNOSIS — Z801 Family history of malignant neoplasm of trachea, bronchus and lung: Secondary | ICD-10-CM | POA: Diagnosis not present

## 2014-08-11 DIAGNOSIS — G4733 Obstructive sleep apnea (adult) (pediatric): Secondary | ICD-10-CM | POA: Diagnosis present

## 2014-08-11 DIAGNOSIS — Z833 Family history of diabetes mellitus: Secondary | ICD-10-CM

## 2014-08-11 DIAGNOSIS — K429 Umbilical hernia without obstruction or gangrene: Secondary | ICD-10-CM | POA: Diagnosis present

## 2014-08-11 DIAGNOSIS — F329 Major depressive disorder, single episode, unspecified: Secondary | ICD-10-CM | POA: Diagnosis present

## 2014-08-11 DIAGNOSIS — Z9049 Acquired absence of other specified parts of digestive tract: Secondary | ICD-10-CM | POA: Diagnosis present

## 2014-08-11 DIAGNOSIS — Z806 Family history of leukemia: Secondary | ICD-10-CM

## 2014-08-11 DIAGNOSIS — I10 Essential (primary) hypertension: Secondary | ICD-10-CM | POA: Diagnosis present

## 2014-08-11 DIAGNOSIS — K219 Gastro-esophageal reflux disease without esophagitis: Secondary | ICD-10-CM | POA: Diagnosis present

## 2014-08-11 DIAGNOSIS — C61 Malignant neoplasm of prostate: Principal | ICD-10-CM | POA: Diagnosis present

## 2014-08-11 DIAGNOSIS — Z79899 Other long term (current) drug therapy: Secondary | ICD-10-CM

## 2014-08-11 HISTORY — PX: LYMPHADENECTOMY: SHX5960

## 2014-08-11 HISTORY — PX: ROBOT ASSISTED LAPAROSCOPIC RADICAL PROSTATECTOMY: SHX5141

## 2014-08-11 LAB — BASIC METABOLIC PANEL
Anion gap: 11 (ref 5–15)
BUN: 13 mg/dL (ref 6–23)
CALCIUM: 9 mg/dL (ref 8.4–10.5)
CO2: 25 mmol/L (ref 19–32)
Chloride: 105 mmol/L (ref 96–112)
Creatinine, Ser: 1.21 mg/dL (ref 0.50–1.35)
GFR calc Af Amer: 71 mL/min — ABNORMAL LOW (ref >90)
GFR calc non Af Amer: 62 mL/min — ABNORMAL LOW (ref >90)
Glucose, Bld: 192 mg/dL — ABNORMAL HIGH (ref 70–99)
Potassium: 3.7 mmol/L (ref 3.5–5.1)
SODIUM: 141 mmol/L (ref 135–145)

## 2014-08-11 LAB — TYPE AND SCREEN
ABO/RH(D): A POS
Antibody Screen: NEGATIVE

## 2014-08-11 LAB — ABO/RH: ABO/RH(D): A POS

## 2014-08-11 LAB — GLUCOSE, CAPILLARY
GLUCOSE-CAPILLARY: 162 mg/dL — AB (ref 70–99)
Glucose-Capillary: 107 mg/dL — ABNORMAL HIGH (ref 70–99)
Glucose-Capillary: 127 mg/dL — ABNORMAL HIGH (ref 70–99)
Glucose-Capillary: 171 mg/dL — ABNORMAL HIGH (ref 70–99)
Glucose-Capillary: 193 mg/dL — ABNORMAL HIGH (ref 70–99)

## 2014-08-11 LAB — HEMOGLOBIN AND HEMATOCRIT, BLOOD
HCT: 39.2 % (ref 39.0–52.0)
Hemoglobin: 13.6 g/dL (ref 13.0–17.0)

## 2014-08-11 SURGERY — ROBOTIC ASSISTED LAPAROSCOPIC RADICAL PROSTATECTOMY LEVEL 2
Anesthesia: General

## 2014-08-11 MED ORDER — MIDAZOLAM HCL 2 MG/2ML IJ SOLN
INTRAMUSCULAR | Status: AC
Start: 2014-08-11 — End: 2014-08-11
  Filled 2014-08-11: qty 2

## 2014-08-11 MED ORDER — LACTATED RINGERS IV SOLN: INTRAVENOUS | Status: DC

## 2014-08-11 MED ORDER — PROPOFOL 10 MG/ML IV BOLUS
INTRAVENOUS | Status: AC
  Filled 2014-08-11: qty 20

## 2014-08-11 MED ORDER — ONDANSETRON HCL 4 MG/2ML IJ SOLN: 4.0000 mg | INTRAMUSCULAR | Status: DC | PRN

## 2014-08-11 MED ORDER — LACTATED RINGERS IV SOLN
INTRAVENOUS | Status: DC | PRN
  Administered 2014-08-11 (×2): via INTRAVENOUS

## 2014-08-11 MED ORDER — HEPARIN SODIUM (PORCINE) 1000 UNIT/ML IJ SOLN
INTRAMUSCULAR | Status: AC
Start: 2014-08-11 — End: 2014-08-11
  Filled 2014-08-11: qty 1

## 2014-08-11 MED ORDER — NEOSTIGMINE METHYLSULFATE 10 MG/10ML IV SOLN
INTRAVENOUS | Status: AC
  Filled 2014-08-11: qty 1

## 2014-08-11 MED ORDER — KETOROLAC TROMETHAMINE 15 MG/ML IJ SOLN
INTRAMUSCULAR | Status: AC
Start: 2014-08-11 — End: 2014-08-11
  Filled 2014-08-11: qty 1

## 2014-08-11 MED ORDER — ONDANSETRON HCL 4 MG/2ML IJ SOLN
INTRAMUSCULAR | Status: DC | PRN
  Administered 2014-08-11: 4 mg via INTRAVENOUS

## 2014-08-11 MED ORDER — CISATRACURIUM BESYLATE 20 MG/10ML IV SOLN
INTRAVENOUS | Status: AC
Start: 2014-08-11 — End: 2014-08-11
  Filled 2014-08-11: qty 10

## 2014-08-11 MED ORDER — KETOROLAC TROMETHAMINE 15 MG/ML IJ SOLN
15.0000 mg | Freq: Four times a day (QID) | INTRAMUSCULAR | Status: DC
  Administered 2014-08-11 – 2014-08-12 (×5): 15 mg via INTRAVENOUS
  Filled 2014-08-11 (×6): qty 1

## 2014-08-11 MED ORDER — HYDROCODONE-ACETAMINOPHEN 5-325 MG PO TABS: 1.0000 | ORAL_TABLET | Freq: Four times a day (QID) | ORAL | Status: DC | PRN

## 2014-08-11 MED ORDER — LACTATED RINGERS IV SOLN
INTRAVENOUS | Status: DC | PRN
  Administered 2014-08-11: 08:00:00

## 2014-08-11 MED ORDER — CIPROFLOXACIN HCL 500 MG PO TABS: 500.0000 mg | ORAL_TABLET | Freq: Two times a day (BID) | ORAL | Status: DC

## 2014-08-11 MED ORDER — SODIUM CHLORIDE 0.9 % IJ SOLN
INTRAMUSCULAR | Status: AC
Start: 2014-08-11 — End: 2014-08-11
  Filled 2014-08-11: qty 10

## 2014-08-11 MED ORDER — KCL IN DEXTROSE-NACL 20-5-0.45 MEQ/L-%-% IV SOLN
INTRAVENOUS | Status: AC
Start: 2014-08-11 — End: 2014-08-11
  Filled 2014-08-11: qty 1000

## 2014-08-11 MED ORDER — ONDANSETRON HCL 4 MG/2ML IJ SOLN
INTRAMUSCULAR | Status: AC
Start: 2014-08-11 — End: 2014-08-11
  Filled 2014-08-11: qty 2

## 2014-08-11 MED ORDER — CEFAZOLIN SODIUM-DEXTROSE 2-3 GM-% IV SOLR
2.0000 g | INTRAVENOUS | Status: AC
  Administered 2014-08-11: 2 g via INTRAVENOUS

## 2014-08-11 MED ORDER — SODIUM CHLORIDE 0.9 % IR SOLN
Status: DC | PRN
  Administered 2014-08-11: 1000 mL

## 2014-08-11 MED ORDER — FLUOXETINE HCL 20 MG PO CAPS
20.0000 mg | ORAL_CAPSULE | Freq: Every evening | ORAL | Status: DC
  Administered 2014-08-11: 20 mg via ORAL
  Filled 2014-08-11 (×2): qty 1

## 2014-08-11 MED ORDER — FENTANYL CITRATE 0.05 MG/ML IJ SOLN
INTRAMUSCULAR | Status: DC | PRN
  Administered 2014-08-11: 50 ug via INTRAVENOUS
  Administered 2014-08-11 (×2): 25 ug via INTRAVENOUS
  Administered 2014-08-11 (×4): 50 ug via INTRAVENOUS

## 2014-08-11 MED ORDER — SUCCINYLCHOLINE CHLORIDE 20 MG/ML IJ SOLN
INTRAMUSCULAR | Status: DC | PRN
  Administered 2014-08-11: 140 mg via INTRAVENOUS

## 2014-08-11 MED ORDER — CEFAZOLIN SODIUM 1-5 GM-% IV SOLN
1.0000 g | Freq: Three times a day (TID) | INTRAVENOUS | Status: AC
Start: 2014-08-11 — End: 2014-08-11
  Administered 2014-08-11 (×2): 1 g via INTRAVENOUS
  Filled 2014-08-11 (×2): qty 50

## 2014-08-11 MED ORDER — DOCUSATE SODIUM 100 MG PO CAPS
100.0000 mg | ORAL_CAPSULE | Freq: Two times a day (BID) | ORAL | Status: DC
  Administered 2014-08-11 – 2014-08-12 (×2): 100 mg via ORAL
  Filled 2014-08-11 (×3): qty 1

## 2014-08-11 MED ORDER — MIDAZOLAM HCL 5 MG/5ML IJ SOLN
INTRAMUSCULAR | Status: DC | PRN
  Administered 2014-08-11 (×2): 1 mg via INTRAVENOUS

## 2014-08-11 MED ORDER — NEOSTIGMINE METHYLSULFATE 10 MG/10ML IV SOLN
INTRAVENOUS | Status: DC | PRN
  Administered 2014-08-11: 3 mg via INTRAVENOUS

## 2014-08-11 MED ORDER — PROPOFOL 10 MG/ML IV BOLUS
INTRAVENOUS | Status: DC | PRN
  Administered 2014-08-11: 200 mg via INTRAVENOUS

## 2014-08-11 MED ORDER — GLYCOPYRROLATE 0.2 MG/ML IJ SOLN
INTRAMUSCULAR | Status: AC
  Filled 2014-08-11: qty 1

## 2014-08-11 MED ORDER — MORPHINE SULFATE 2 MG/ML IJ SOLN
2.0000 mg | INTRAMUSCULAR | Status: DC | PRN
Start: 2014-08-11 — End: 2014-08-12

## 2014-08-11 MED ORDER — ACETAMINOPHEN 10 MG/ML IV SOLN
1000.0000 mg | Freq: Once | INTRAVENOUS | Status: AC
  Administered 2014-08-11: 1000 mg via INTRAVENOUS
  Filled 2014-08-11: qty 100

## 2014-08-11 MED ORDER — GLYCOPYRROLATE 0.2 MG/ML IJ SOLN
INTRAMUSCULAR | Status: AC
  Filled 2014-08-11: qty 3

## 2014-08-11 MED ORDER — CISATRACURIUM BESYLATE (PF) 10 MG/5ML IV SOLN
INTRAVENOUS | Status: DC | PRN
  Administered 2014-08-11: 10 mg via INTRAVENOUS
  Administered 2014-08-11 (×3): 2 mg via INTRAVENOUS
  Administered 2014-08-11: 4 mg via INTRAVENOUS

## 2014-08-11 MED ORDER — DIPHENHYDRAMINE HCL 50 MG/ML IJ SOLN: 12.5000 mg | Freq: Four times a day (QID) | INTRAMUSCULAR | Status: DC | PRN

## 2014-08-11 MED ORDER — KCL IN DEXTROSE-NACL 20-5-0.45 MEQ/L-%-% IV SOLN
INTRAVENOUS | Status: DC
  Administered 2014-08-11 (×2): via INTRAVENOUS
  Filled 2014-08-11 (×4): qty 1000

## 2014-08-11 MED ORDER — HYDROMORPHONE HCL 1 MG/ML IJ SOLN
0.2500 mg | INTRAMUSCULAR | Status: DC | PRN
Start: 2014-08-11 — End: 2014-08-11
  Administered 2014-08-11 (×2): 0.5 mg via INTRAVENOUS

## 2014-08-11 MED ORDER — LIDOCAINE HCL (CARDIAC) 20 MG/ML IV SOLN
INTRAVENOUS | Status: DC | PRN
  Administered 2014-08-11: 75 mg via INTRAVENOUS

## 2014-08-11 MED ORDER — GLYCOPYRROLATE 0.2 MG/ML IJ SOLN
INTRAMUSCULAR | Status: DC | PRN
Start: 2014-08-11 — End: 2014-08-11
  Administered 2014-08-11: .6 mg via INTRAVENOUS
  Administered 2014-08-11: .1 mg via INTRAVENOUS

## 2014-08-11 MED ORDER — HYDROMORPHONE HCL 1 MG/ML IJ SOLN
INTRAMUSCULAR | Status: AC
  Filled 2014-08-11: qty 1

## 2014-08-11 MED ORDER — LACTATED RINGERS IV BOLUS (SEPSIS)
1000.0000 mL | Freq: Once | INTRAVENOUS | Status: AC
  Administered 2014-08-11: 1000 mL via INTRAVENOUS

## 2014-08-11 MED ORDER — DEXAMETHASONE SODIUM PHOSPHATE 10 MG/ML IJ SOLN
INTRAMUSCULAR | Status: DC | PRN
  Administered 2014-08-11: 10 mg via INTRAVENOUS

## 2014-08-11 MED ORDER — FENTANYL CITRATE 0.05 MG/ML IJ SOLN
INTRAMUSCULAR | Status: AC
  Filled 2014-08-11: qty 5

## 2014-08-11 MED ORDER — DIPHENHYDRAMINE HCL 12.5 MG/5ML PO ELIX: 12.5000 mg | ORAL_SOLUTION | Freq: Four times a day (QID) | ORAL | Status: DC | PRN

## 2014-08-11 MED ORDER — ACETAMINOPHEN 325 MG PO TABS
650.0000 mg | ORAL_TABLET | ORAL | Status: DC | PRN
  Administered 2014-08-12: 650 mg via ORAL
  Filled 2014-08-11: qty 2

## 2014-08-11 MED ORDER — SODIUM CHLORIDE 0.9 % IV BOLUS (SEPSIS)
1000.0000 mL | Freq: Once | INTRAVENOUS | Status: AC
  Administered 2014-08-11: 1000 mL via INTRAVENOUS

## 2014-08-11 MED ORDER — SIMVASTATIN 40 MG PO TABS
40.0000 mg | ORAL_TABLET | Freq: Every evening | ORAL | Status: DC
  Administered 2014-08-11: 40 mg via ORAL
  Filled 2014-08-11 (×2): qty 1

## 2014-08-11 MED ORDER — SENNA 8.6 MG PO TABS
4.0000 | ORAL_TABLET | Freq: Every day | ORAL | Status: DC
  Administered 2014-08-11: 34.4 mg via ORAL
  Filled 2014-08-11: qty 4

## 2014-08-11 MED ORDER — DEXAMETHASONE SODIUM PHOSPHATE 10 MG/ML IJ SOLN
INTRAMUSCULAR | Status: AC
Start: 2014-08-11 — End: 2014-08-11
  Filled 2014-08-11: qty 1

## 2014-08-11 MED ORDER — LIDOCAINE HCL (CARDIAC) 20 MG/ML IV SOLN
INTRAVENOUS | Status: AC
  Filled 2014-08-11: qty 5

## 2014-08-11 MED ORDER — CEFAZOLIN SODIUM-DEXTROSE 2-3 GM-% IV SOLR
INTRAVENOUS | Status: AC
  Filled 2014-08-11: qty 50

## 2014-08-11 MED ORDER — BUPIVACAINE-EPINEPHRINE (PF) 0.25% -1:200000 IJ SOLN
INTRAMUSCULAR | Status: AC
  Filled 2014-08-11: qty 30

## 2014-08-11 MED ORDER — INSULIN ASPART 100 UNIT/ML ~~LOC~~ SOLN
0.0000 [IU] | SUBCUTANEOUS | Status: DC
  Administered 2014-08-11: 3 [IU] via SUBCUTANEOUS
  Administered 2014-08-11: 2 [IU] via SUBCUTANEOUS
  Administered 2014-08-11 – 2014-08-12 (×2): 3 [IU] via SUBCUTANEOUS

## 2014-08-11 MED ORDER — STERILE WATER FOR IRRIGATION IR SOLN
Status: DC | PRN
Start: 2014-08-11 — End: 2014-08-11
  Administered 2014-08-11: 3000 mL

## 2014-08-11 MED ORDER — EPHEDRINE SULFATE 50 MG/ML IJ SOLN
INTRAMUSCULAR | Status: AC
  Filled 2014-08-11: qty 1

## 2014-08-11 MED ORDER — BUPIVACAINE-EPINEPHRINE 0.25% -1:200000 IJ SOLN
INTRAMUSCULAR | Status: DC | PRN
  Administered 2014-08-11: 30 mL

## 2014-08-11 MED ORDER — METOCLOPRAMIDE HCL 5 MG/ML IJ SOLN
INTRAMUSCULAR | Status: DC | PRN
  Administered 2014-08-11: 10 mg via INTRAVENOUS

## 2014-08-11 SURGICAL SUPPLY — 51 items
CABLE HIGH FREQUENCY MONO STRZ (ELECTRODE) ×4 IMPLANT
CATH FOLEY 2WAY SLVR 18FR 30CC (CATHETERS) ×4 IMPLANT
CATH ROBINSON RED A/P 16FR (CATHETERS) ×4 IMPLANT
CATH ROBINSON RED A/P 8FR (CATHETERS) ×4 IMPLANT
CATH TIEMANN FOLEY 18FR 5CC (CATHETERS) ×4 IMPLANT
CHLORAPREP W/TINT 26ML (MISCELLANEOUS) ×4 IMPLANT
CLIP LIGATING HEM O LOK PURPLE (MISCELLANEOUS) ×10 IMPLANT
CLOTH BEACON ORANGE TIMEOUT ST (SAFETY) ×4 IMPLANT
COVER SURGICAL LIGHT HANDLE (MISCELLANEOUS) ×4 IMPLANT
COVER TIP SHEARS 8 DVNC (MISCELLANEOUS) ×2 IMPLANT
COVER TIP SHEARS 8MM DA VINCI (MISCELLANEOUS) ×2
CUTTER ECHEON FLEX ENDO 45 340 (ENDOMECHANICALS) ×4 IMPLANT
DECANTER SPIKE VIAL GLASS SM (MISCELLANEOUS) ×2 IMPLANT
DRAPE SURG IRRIG POUCH 19X23 (DRAPES) ×4 IMPLANT
DRSG TEGADERM 4X4.75 (GAUZE/BANDAGES/DRESSINGS) ×4 IMPLANT
DRSG TEGADERM 6X8 (GAUZE/BANDAGES/DRESSINGS) ×8 IMPLANT
ELECT REM PT RETURN 9FT ADLT (ELECTROSURGICAL) ×4
ELECTRODE REM PT RTRN 9FT ADLT (ELECTROSURGICAL) ×2 IMPLANT
GLOVE BIO SURGEON STRL SZ 6.5 (GLOVE) ×3 IMPLANT
GLOVE BIO SURGEONS STRL SZ 6.5 (GLOVE) ×1
GLOVE BIOGEL M STRL SZ7.5 (GLOVE) ×8 IMPLANT
GOWN STRL REUS W/TWL LRG LVL3 (GOWN DISPOSABLE) ×12 IMPLANT
HOLDER FOLEY CATH W/STRAP (MISCELLANEOUS) ×4 IMPLANT
IV LACTATED RINGERS 1000ML (IV SOLUTION) ×2 IMPLANT
KIT ACCESSORY DA VINCI DISP (KITS) ×2
KIT ACCESSORY DVNC DISP (KITS) ×2 IMPLANT
LIQUID BAND (GAUZE/BANDAGES/DRESSINGS) ×2 IMPLANT
MANIFOLD NEPTUNE II (INSTRUMENTS) ×4 IMPLANT
NDL SAFETY ECLIPSE 18X1.5 (NEEDLE) ×2 IMPLANT
NEEDLE HYPO 18GX1.5 SHARP (NEEDLE) ×4
PACK ROBOT UROLOGY CUSTOM (CUSTOM PROCEDURE TRAY) ×4 IMPLANT
RELOAD GREEN ECHELON 45 (STAPLE) ×4 IMPLANT
SET TUBE IRRIG SUCTION NO TIP (IRRIGATION / IRRIGATOR) ×4 IMPLANT
SHEET LAVH (DRAPES) ×2 IMPLANT
SOLUTION ELECTROLUBE (MISCELLANEOUS) ×4 IMPLANT
SUT ETHILON 3 0 PS 1 (SUTURE) ×4 IMPLANT
SUT MNCRL 3 0 RB1 (SUTURE) ×2 IMPLANT
SUT MNCRL 3 0 VIOLET RB1 (SUTURE) ×2 IMPLANT
SUT MNCRL AB 4-0 PS2 18 (SUTURE) ×8 IMPLANT
SUT MONOCRYL 3 0 RB1 (SUTURE) ×4
SUT NOVA 0 T19/GS 22DT (SUTURE) ×2 IMPLANT
SUT VIC AB 0 CT1 27 (SUTURE) ×4
SUT VIC AB 0 CT1 27XBRD ANTBC (SUTURE) ×2 IMPLANT
SUT VIC AB 0 UR5 27 (SUTURE) ×4 IMPLANT
SUT VIC AB 2-0 SH 27 (SUTURE) ×4
SUT VIC AB 2-0 SH 27X BRD (SUTURE) ×2 IMPLANT
SUT VICRYL 0 UR6 27IN ABS (SUTURE) ×8 IMPLANT
SYR 27GX1/2 1ML LL SAFETY (SYRINGE) ×4 IMPLANT
TOWEL OR 17X26 10 PK STRL BLUE (TOWEL DISPOSABLE) ×4 IMPLANT
TOWEL OR NON WOVEN STRL DISP B (DISPOSABLE) ×4 IMPLANT
WATER STERILE IRR 1500ML POUR (IV SOLUTION) ×4 IMPLANT

## 2014-08-11 NOTE — Transfer of Care (Signed)
Immediate Anesthesia Transfer of Care Note  Patient: ARAMIS WEIL  Procedure(s) Performed: Procedure(s): ROBOTIC ASSISTED LAPAROSCOPIC RADICAL PROSTATECTOMY LEVEL 2 UMBILICAL HERNIA REPAIR (N/A) PELVIC LYMPHADENECTOMY (Bilateral)  Patient Location: PACU  Anesthesia Type:General  Level of Consciousness: awake, oriented, patient cooperative, lethargic and responds to stimulation  Airway & Oxygen Therapy: Patient Spontanous Breathing and Patient connected to face mask oxygen  Post-op Assessment: Report given to RN, Post -op Vital signs reviewed and stable and Patient moving all extremities  Post vital signs: Reviewed and stable  Last Vitals:  Filed Vitals:   08/11/14 0508  BP: 179/82  Pulse: 67  Temp: 36.7 C  Resp: 18    Complications: No apparent anesthesia complications

## 2014-08-11 NOTE — Discharge Instructions (Signed)

## 2014-08-11 NOTE — Op Note (Signed)
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 Robotic assisted laparoscopic radical prostatectomy (bilateral nerve sparing) 2. Bilateral robotic assisted laparoscopic pelvic lymphadenectomy 3. Umbilical hernia repair  Surgeon: Pryor Curia. M.D.  Assistant: Debbrah Alar, PA-C  Anesthesia: General  Complications: None  EBL: 100 mL  IVF:  1500 mL crystalloid  Specimens: 1. Prostate and seminal vesicles 2. Right pelvic lymph nodes 3. Left pelvic lymph nodes  Disposition of specimens: Pathology  Drains: 1. 20 Fr coude catheter 2. # 19 Blake pelvic drain  Indication: [name] is a 64 y.o. year old patient with clinically localized prostate cancer and an umbilical hernia.  After a thorough review of the management options for treatment of prostate cancer, he elected to proceed with surgical therapy and the above procedure(s).  We have discussed the potential benefits and risks of the procedure, side effects of the proposed treatment, the likelihood of the patient achieving the goals of the procedure, and any potential problems that might occur during the procedure or recuperation. Informed consent has been obtained.  Description of procedure:  The patient was taken to the operating room and a general anesthetic was administered. He was given preoperative antibiotics, placed in the dorsal lithotomy position, and prepped and draped in the usual sterile fashion. Next a preoperative timeout was performed. A urethral catheter was placed into the bladder and a site was selected near the umbilicus for placement of the camera port. This was placed using a standard open Hassan technique which allowed entry into the peritoneal cavity under direct vision and without  difficulty. A 12 mm port was placed and a pneumoperitoneum established. The camera was then used to inspect the abdomen and there was no evidence of any intra-abdominal injuries or other abnormalities. The remaining abdominal ports were then placed. 8 mm robotic ports were placed in the right lower quadrant, left lower quadrant, and far left lateral abdominal wall. A 5 mm port was placed in the right upper quadrant and a 12 mm port was placed in the right lateral abdominal wall for laparoscopic assistance. All ports were placed under direct vision without difficulty. The surgical cart was then docked.   Utilizing the cautery scissors, the bladder was reflected posteriorly allowing entry into the space of Retzius and identification of the endopelvic fascia and prostate. The periprostatic fat was then removed from the prostate allowing full exposure of the endopelvic fascia. The endopelvic fascia was then incised from the apex back to the base of the prostate bilaterally and the underlying levator muscle fibers were swept laterally off the prostate thereby isolating the dorsal venous complex. The dorsal vein was then stapled and divided with a 45 mm Flex Echelon stapler. Attention then turned to the bladder neck which was divided anteriorly thereby allowing entry into the bladder and exposure of the urethral catheter. The catheter balloon was deflated and the catheter was brought into the operative field and used to retract the prostate anteriorly. The posterior bladder neck was then examined and was divided allowing further dissection between the bladder and prostate posteriorly until the vasa deferentia and seminal vessels were identified. The vasa deferentia were isolated, divided, and lifted anteriorly. The seminal vesicles were dissected down to their tips with care to control the seminal vascular arterial blood supply. These structures were then lifted anteriorly and the space  between Denonvillier's fascia and  the anterior rectum was developed with a combination of sharp and blunt dissection. This isolated the vascular pedicles of the prostate.  The lateral prostatic fascia was then sharply incised allowing release of the neurovascular bundles bilaterally. The vascular pedicles of the prostate were then ligated with Weck clips between the prostate and neurovascular bundles and divided with sharp cold scissor dissection resulting in neurovascular bundle preservation. The neurovascular bundles were then separated off the apex of the prostate and urethra bilaterally.  The urethra was then sharply transected allowing the prostate specimen to be disarticulated. The pelvis was copiously irrigated and hemostasis was ensured. There was no evidence for rectal injury.  Attention then turned to the right pelvic sidewall. The fibrofatty tissue between the external iliac vein, confluence of the iliac vessels, hypogastric artery, and Cooper's ligament was dissected free from the pelvic sidewall with care to preserve the obturator nerve. Weck clips were used for lymphostasis and hemostasis. An identical procedure was performed on the contralateral side and the lymphatic packets were removed for permanent pathologic analysis.  Attention then turned to the urethral anastomosis. A 2-0 Vicryl slip knot was placed between Denonvillier's fascia, the posterior bladder neck, and the posterior urethra to reapproximate these structures. A double-armed 3-0 Monocryl suture was then used to perform a 360 running tension-free anastomosis between the bladder neck and urethra. A new urethral catheter was then placed into the bladder and irrigated. There were no blood clots within the bladder and the anastomosis appeared to be watertight. A #19 Blake drain was then brought through the left lateral 8 mm port site and positioned appropriately within the pelvis. It was secured to the skin with a nylon suture. The surgical cart was then undocked.  The right lateral 12 mm port site was closed at the fascial level with a 0 Vicryl suture placed laparoscopically. All remaining ports were then removed under direct vision. The prostate specimen was removed intact within the Endopouch retrieval bag via the periumbilical camera port site. This fascial opening was opened in a periumbilical fashion to encompass the umbilical hernia defect. The fascial edges were then closed with interrupted figure of eight 0-Novofil sutures to repair the hernia defect and close the fascia. 0.25% Marcaine was then injected into all port sites and all incisions were reapproximated at the skin level with 4-0 Monocryl subcuticular sutures and Dermabond. The patient appeared to tolerate the procedure well and without complications. The patient was able to be extubated and transferred to the recovery unit in satisfactory condition.   Pryor Curia MD

## 2014-08-11 NOTE — Progress Notes (Signed)
Post-op note  Subjective: The patient is doing well.  No complaints. Has ambulated.  No N/V  Objective: Vital signs in last 24 hours: Temp:  [97.5 F (36.4 C)-98.1 F (36.7 C)] 98.1 F (36.7 C) (04/04 1145) Pulse Rate:  [67-98] 98 (04/04 1228) Resp:  [15-18] 16 (04/04 1228) BP: (158-179)/(57-82) 161/78 mmHg (04/04 1228) SpO2:  [97 %-100 %] 97 % (04/04 1228) Weight:  [112.492 kg (248 lb)] 112.492 kg (248 lb) (04/04 0552)  Intake/Output from previous day:   Intake/Output this shift: Total I/O In: 2000 [I.V.:2000] Out: 305 [Urine:150; Drains:30; Blood:125]  Physical Exam:  General: Alert and oriented. Abdomen: Soft, Nondistended. Incisions: Clean and dry. Urine: red  Lab Results:  Recent Labs  08/11/14 1105  HGB 13.6  HCT 39.2    Assessment/Plan: POD#0   1) Continue to monitor  2) DVT prophy, clears, IS, amb, pain control   LOS: 0 days   Oluwatomisin Hustead 08/11/2014, 3:25 PM

## 2014-08-11 NOTE — Anesthesia Postprocedure Evaluation (Signed)
  Anesthesia Post-op Note  Patient: [name]  Procedure(s) Performed: Procedure(s) (LRB): ROBOTIC ASSISTED LAPAROSCOPIC RADICAL PROSTATECTOMY LEVEL 2 UMBILICAL HERNIA REPAIR (N/A) PELVIC LYMPHADENECTOMY (Bilateral)  Patient Location: PACU  Anesthesia Type: General  Level of Consciousness: awake and alert   Airway and Oxygen Therapy: Patient Spontanous Breathing  Post-op Pain: mild  Post-op Assessment: Post-op Vital signs reviewed, Patient's Cardiovascular Status Stable, Respiratory Function Stable, Patent Airway and No signs of Nausea or vomiting  Last Vitals:  Filed Vitals:   08/11/14 1228  BP: 161/78  Pulse: 98  Temp:   Resp: 16    Post-op Vital Signs: stable   Complications: No apparent anesthesia complications

## 2014-08-11 NOTE — Progress Notes (Signed)
Pt stated that he would self administer CPAP when ready for bed.  Current settings are 10 CMH20 per Pt home settings via pt home mask and tubing.  Pt to notify RT if any assistance is required throughout the night.  RT to monitor and assess as needed.

## 2014-08-11 NOTE — Anesthesia Procedure Notes (Signed)
Procedure Name: Intubation Date/Time: 08/11/2014 7:26 AM Performed by: Ofilia Neas Pre-anesthesia Checklist: Patient identified, Emergency Drugs available, Suction available, Patient being monitored and Timeout performed Patient Re-evaluated:Patient Re-evaluated prior to inductionOxygen Delivery Method: Circle system utilized Preoxygenation: Pre-oxygenation with 100% oxygen Intubation Type: IV induction and Cricoid Pressure applied Ventilation: Mask ventilation without difficulty Laryngoscope Size: Mac and 4 Grade View: Grade I Tube type: Oral Tube size: 7.5 mm Number of attempts: 1 Airway Equipment and Method: Stylet Placement Confirmation: ETT inserted through vocal cords under direct vision,  positive ETCO2 and breath sounds checked- equal and bilateral Secured at: 21 cm Tube secured with: Tape

## 2014-08-11 NOTE — Interval H&P Note (Signed)
History and Physical Interval Note:  08/11/2014 7:06 AM  [name]  has presented today for surgery, with the diagnosis of PROSTATE CANCER  The various methods of treatment have been discussed with the patient and family. After consideration of risks, benefits and other options for treatment, the patient has consented to  Procedure(s): ROBOTIC ASSISTED LAPAROSCOPIC RADICAL PROSTATECTOMY LEVEL 2 UMBILICAL HERNIA REPAIR (N/A) PELVIC LYMPHADENECTOMY (Bilateral) as a surgical intervention .  The patient's history has been reviewed, patient examined, no change in status, stable for surgery.  I have reviewed the patient's chart and labs.  Questions were answered to the patient's satisfaction.     Nelia Rogoff,LES

## 2014-08-12 LAB — BASIC METABOLIC PANEL
Anion gap: 6 (ref 5–15)
BUN: 8 mg/dL (ref 6–23)
CHLORIDE: 109 mmol/L (ref 96–112)
CO2: 26 mmol/L (ref 19–32)
Calcium: 8.7 mg/dL (ref 8.4–10.5)
Creatinine, Ser: 1 mg/dL (ref 0.50–1.35)
GFR calc Af Amer: 90 mL/min — ABNORMAL LOW (ref >90)
GFR calc non Af Amer: 78 mL/min — ABNORMAL LOW (ref >90)
GLUCOSE: 149 mg/dL — AB (ref 70–99)
POTASSIUM: 4.1 mmol/L (ref 3.5–5.1)
SODIUM: 141 mmol/L (ref 135–145)

## 2014-08-12 LAB — GLUCOSE, CAPILLARY
GLUCOSE-CAPILLARY: 152 mg/dL — AB (ref 70–99)
Glucose-Capillary: 134 mg/dL — ABNORMAL HIGH (ref 70–99)
Glucose-Capillary: 97 mg/dL (ref 70–99)

## 2014-08-12 LAB — HEMOGLOBIN AND HEMATOCRIT, BLOOD
HEMATOCRIT: 38.3 % — AB (ref 39.0–52.0)
HEMOGLOBIN: 12.9 g/dL — AB (ref 13.0–17.0)

## 2014-08-12 MED ORDER — BISACODYL 10 MG RE SUPP
10.0000 mg | Freq: Once | RECTAL | Status: AC
  Administered 2014-08-12: 10 mg via RECTAL
  Filled 2014-08-12: qty 1

## 2014-08-12 MED ORDER — HYDROCODONE-ACETAMINOPHEN 5-325 MG PO TABS: 1.0000 | ORAL_TABLET | Freq: Four times a day (QID) | ORAL | Status: DC | PRN

## 2014-08-12 NOTE — Care Management Utilization Note (Signed)
Ur review done/Betsy Rosello,RN,BSN,CCM

## 2014-08-12 NOTE — Discharge Summary (Signed)
  Date of admission: 08/11/2014  Date of discharge: 08/12/2014  Admission diagnosis: Prostate Cancer  Discharge diagnosis: Prostate Cancer  History and Physical: For full details, please see admission history and physical. Briefly, [name] is a 64 y.o. gentleman with localized prostate cancer.  After discussing management/treatment options, he elected to proceed with surgical treatment.  Hospital Course: [name] was taken to the operating room on 08/11/2014 and underwent a robotic assisted laparoscopic radical prostatectomy. He tolerated this procedure well and without complications. Postoperatively, he was able to be transferred to a regular hospital room following recovery from anesthesia.  He was able to begin ambulating the night of surgery. He remained hemodynamically stable overnight.  He had excellent urine output with appropriately minimal output from his pelvic drain and his pelvic drain was removed on POD #1.  He was transitioned to oral pain medication, tolerated a clear liquid diet, and had met all discharge criteria and was able to be discharged home later on POD#1.  Laboratory values:  Recent Labs  08/11/14 1105 08/12/14 0455  HGB 13.6 12.9*  HCT 39.2 38.3*    Disposition: Home  Discharge instruction: He was instructed to be ambulatory but to refrain from heavy lifting, strenuous activity, or driving. He was instructed on urethral catheter care.  Discharge medications:     Medication List    STOP taking these medications        aspirin EC 81 MG tablet     Calcium Carb-Cholecalciferol 600-800 MG-UNIT Tabs     Fish Oil 2336 MG Cpdr     Garlic 1224 MG Caps     ibuprofen 200 MG tablet  Commonly known as:  ADVIL,MOTRIN     multivitamin with minerals Tabs tablet     SUPER B COMPLEX PO      TAKE these medications        ciprofloxacin 500 MG tablet  Commonly known as:  CIPRO  Take 1 tablet (500 mg total) by mouth 2 (two) times daily. Start day prior to  office visit for foley removal     docusate sodium 100 MG capsule  Commonly known as:  COLACE  Take 300 mg by mouth every morning.     FLUoxetine 20 MG tablet  Commonly known as:  PROZAC  Take 20 mg by mouth every evening.     HYDROcodone-acetaminophen 5-325 MG per tablet  Commonly known as:  NORCO  Take 1-2 tablets by mouth every 6 (six) hours as needed.     metFORMIN 500 MG tablet  Commonly known as:  GLUCOPHAGE  Take 500-1,000 mg by mouth 2 (two) times daily with a meal. One tab in AM and Two tabs in PM     ramipril 10 MG tablet  Commonly known as:  ALTACE  Take 10 mg by mouth every morning.     senna 8.6 MG tablet  Commonly known as:  SENOKOT  Take 4 tablets by mouth at bedtime.     simvastatin 40 MG tablet  Commonly known as:  ZOCOR  Take 40 mg by mouth every evening.     sitaGLIPtin 100 MG tablet  Commonly known as:  JANUVIA  Take 100 mg by mouth every morning.        Followup: He will followup in 1 week for catheter removal and to discuss his surgical pathology results.

## 2014-08-12 NOTE — Progress Notes (Signed)
Pt left at this time with his family. Alert, oriented, and without c/o. Foley catheter to drainage bag intact. Discharge instructions/prescriptions given/explained to pt and family with verbalization of understanding. Followup appointment noted with Dr. Alinda Money. Leg baq teaching and supplies given to pt and family. JP drain removed and dressing intact.

## 2014-08-12 NOTE — Progress Notes (Signed)
Patient ID: [name], male   DOB: Aug 24, 1950, 64 y.o.   MRN: [id number]  1 Day Post-Op Subjective: The patient is doing well.  No nausea or vomiting. Pain is adequately controlled.  Objective: Vital signs in last 24 hours: Temp:  [97.5 F (36.4 C)-98.9 F (37.2 C)] 98.9 F (37.2 C) (04/05 0506) Pulse Rate:  [72-98] 73 (04/05 0506) Resp:  [15-16] 16 (04/05 0506) BP: (134-166)/(57-80) 153/71 mmHg (04/05 0506) SpO2:  [96 %-100 %] 97 % (04/05 0506)  Intake/Output from previous day: 04/04 0701 - 04/05 0700 In: 3587 [P.O.:222; I.V.:3365] Out: 0761 [Urine:3150; Drains:165; Blood:125] Intake/Output this shift:    Physical Exam:  General: Alert and oriented. CV: RRR Lungs: Clear bilaterally. GI: Soft, Nondistended. Incisions: Clean, dry, and intact Urine: Clear Extremities: Nontender, no erythema, no edema.  Lab Results:  Recent Labs  08/11/14 1105 08/12/14 0455  HGB 13.6 12.9*  HCT 39.2 38.3*      Assessment/Plan: POD# 1 s/p robotic prostatectomy.  1) SL IVF 2) Ambulate, Incentive spirometry 3) Transition to oral pain medication 4) Dulcolax suppository 5) D/C pelvic drain 6) Plan for likely discharge later today   [name]. MD   LOS: 1 day   [name],LES 08/12/2014, 7:13 AM

## 2014-08-13 ENCOUNTER — Encounter (HOSPITAL_COMMUNITY): Payer: Self-pay | Admitting: Urology

## 2015-02-11 ENCOUNTER — Other Ambulatory Visit: Payer: Self-pay

## 2015-05-21 MED FILL — metFORMIN HCL 500 MG TABS: 500 | 31 days supply | Qty: 93 | Fill #2

## 2015-05-21 MED FILL — FLUoxetine HCL 20 MG CAPS: 20 | 31 days supply | Qty: 31 | Fill #2

## 2015-05-21 MED FILL — SIMVASTATIN 40 MG TABLET: 40 | 31 days supply | Qty: 31 | Fill #7

## 2015-05-21 MED FILL — RAMIPRIL 10 MG CAPSULE: 10 | 30 days supply | Qty: 30 | Fill #2

## 2015-05-21 MED FILL — JANUVIA 100 MG TABLET: 100 | 30 days supply | Qty: 30 | Fill #2

## 2015-05-29 MED FILL — KETOCONAZOLE 2% CREAM: 2 | 30 days supply | Qty: 60 | Fill #0

## 2015-06-26 MED FILL — RAMIPRIL 10 MG CAPSULE: 10 | 30 days supply | Qty: 30 | Fill #3

## 2015-06-26 MED FILL — FLUoxetine HCL 20 MG CAPS: 20 | 31 days supply | Qty: 31 | Fill #3

## 2015-06-26 MED FILL — SIMVASTATIN 40 MG TABLET: 40 | 31 days supply | Qty: 31 | Fill #8

## 2015-06-26 MED FILL — metFORMIN HCL 500 MG TABS: 500 | 31 days supply | Qty: 93 | Fill #3

## 2015-06-26 MED FILL — JANUVIA 100 MG TABLET: 100 | 30 days supply | Qty: 30 | Fill #3

## 2015-07-28 MED FILL — FLUoxetine HCL 20 MG CAPS: 20 | 31 days supply | Qty: 31 | Fill #4

## 2015-07-28 MED FILL — JANUVIA 100 MG TABLET: 100 | 30 days supply | Qty: 30 | Fill #4

## 2015-07-28 MED FILL — metFORMIN HCL 500 MG TABS: 500 | 31 days supply | Qty: 93 | Fill #4

## 2015-07-28 MED FILL — RAMIPRIL 10 MG CAPSULE: 10 | 30 days supply | Qty: 30 | Fill #4

## 2015-07-28 MED FILL — SIMVASTATIN 40 MG TABLET: 40 | 31 days supply | Qty: 31 | Fill #9

## 2015-08-27 MED FILL — CLOBETASOL 0.05% SOLUTION: 0.05 | 30 days supply | Qty: 50 | Fill #0

## 2015-09-09 MED FILL — RAMIPRIL 10 MG CAPSULE: 10 | 30 days supply | Qty: 30 | Fill #5

## 2015-09-09 MED FILL — metFORMIN HCL 500 MG TABS: 500 | 31 days supply | Qty: 93 | Fill #5

## 2015-09-09 MED FILL — SIMVASTATIN 40 MG TABLET: 40 | 31 days supply | Qty: 31 | Fill #10

## 2015-09-09 MED FILL — JANUVIA 100 MG TABLET: 100 | 30 days supply | Qty: 30 | Fill #5

## 2015-09-09 MED FILL — FLUoxetine HCL 20 MG CAPS: 20 | 31 days supply | Qty: 31 | Fill #0

## 2015-10-21 MED FILL — SIMVASTATIN 40 MG TABLET: 40 | 31 days supply | Qty: 31 | Fill #0

## 2015-10-21 MED FILL — JANUVIA 100 MG TABLET: 100 | 30 days supply | Qty: 30 | Fill #6

## 2015-10-21 MED FILL — metFORMIN HCL 500 MG TABS: 500 | 31 days supply | Qty: 93 | Fill #6

## 2015-10-21 MED FILL — FLUoxetine HCL 20 MG CAPS: 20 | 31 days supply | Qty: 31 | Fill #1

## 2015-10-21 MED FILL — RAMIPRIL 10 MG CAPSULE: 10 | 30 days supply | Qty: 30 | Fill #6

## 2015-11-20 MED FILL — SIMVASTATIN 40 MG TABLET: 40 | 31 days supply | Qty: 31 | Fill #1

## 2015-11-20 MED FILL — metFORMIN HCL 500 MG TABS: 500 | 31 days supply | Qty: 93 | Fill #7

## 2015-11-20 MED FILL — JANUVIA 100 MG TABLET: 100 | 30 days supply | Qty: 30 | Fill #7

## 2015-11-20 MED FILL — RAMIPRIL 10 MG CAPSULE: 10 | 31 days supply | Qty: 31 | Fill #7

## 2015-11-20 MED FILL — FLUoxetine HCL 20 MG CAPS: 20 | 31 days supply | Qty: 31 | Fill #2

## 2015-12-31 MED FILL — RAMIPRIL 10 MG CAPSULE: 10 | 31 days supply | Qty: 31 | Fill #8

## 2015-12-31 MED FILL — FLUoxetine HCL 20 MG CAPS: 20 | 31 days supply | Qty: 31 | Fill #3

## 2015-12-31 MED FILL — metFORMIN HCL 500 MG TABS: 500 | 31 days supply | Qty: 93 | Fill #8

## 2015-12-31 MED FILL — SIMVASTATIN 40 MG TABLET: 40 | 31 days supply | Qty: 31 | Fill #2

## 2015-12-31 MED FILL — JANUVIA 100 MG TABLET: 100 | 30 days supply | Qty: 30 | Fill #8

## 2016-01-28 MED FILL — metFORMIN HCL 500 MG TABS: 500 | 31 days supply | Qty: 93 | Fill #9

## 2016-01-28 MED FILL — FLUoxetine HCL 20 MG CAPS: 20 | 31 days supply | Qty: 31 | Fill #4

## 2016-01-28 MED FILL — SIMVASTATIN 40 MG TABLET: 40 | 31 days supply | Qty: 31 | Fill #3

## 2016-01-28 MED FILL — RAMIPRIL 10 MG CAPSULE: 10 | 31 days supply | Qty: 31 | Fill #9

## 2016-01-28 MED FILL — JANUVIA 100 MG TABLET: 100 | 30 days supply | Qty: 30 | Fill #9

## 2016-02-02 MED FILL — ONE TOUCH DELICA 33G LANCET: 90 days supply | Qty: 100 | Fill #0

## 2016-02-02 MED FILL — ONE TOUCH ULTRA TEST STRIPS: 50 days supply | Qty: 50 | Fill #0

## 2016-02-02 MED FILL — ONE TOUCH ULTRA 2 GLUCOSE S: W/DEVICE | 1 days supply | Qty: 1 | Fill #0

## 2016-02-25 MED FILL — metFORMIN HCL 500 MG TABS: 500 | 31 days supply | Qty: 93 | Fill #10

## 2016-02-25 MED FILL — SIMVASTATIN 40 MG TABLET: 40 | 31 days supply | Qty: 31 | Fill #4

## 2016-02-25 MED FILL — RAMIPRIL 10 MG CAPSULE: 10 | 31 days supply | Qty: 31 | Fill #10

## 2016-02-25 MED FILL — JANUVIA 100 MG TABLET: 100 | 30 days supply | Qty: 30 | Fill #10

## 2016-02-25 MED FILL — FLUoxetine HCL 20 MG CAPS: 20 | 31 days supply | Qty: 31 | Fill #5

## 2016-03-25 MED FILL — FLUOROURACIL 5% CREAM: 5 | 30 days supply | Qty: 40 | Fill #0

## 2016-03-29 MED FILL — JANUVIA 100 MG TABLET: 100 | 30 days supply | Qty: 30 | Fill #0

## 2016-03-29 MED FILL — SIMVASTATIN 40 MG TABLET: 40 | 31 days supply | Qty: 31 | Fill #5

## 2016-03-29 MED FILL — RAMIPRIL 10 MG CAPSULE: 10 | 90 days supply | Qty: 90 | Fill #0

## 2016-03-29 MED FILL — FLUoxetine HCL 20 MG CAPS: 20 | 90 days supply | Qty: 90 | Fill #0

## 2016-03-29 MED FILL — metFORMIN HCL 500 MG TABS: 500 | 90 days supply | Qty: 270 | Fill #0

## 2016-05-03 MED FILL — JANUVIA 100 MG TABLET: 100 | 30 days supply | Qty: 30 | Fill #1

## 2016-05-31 MED FILL — SIMVASTATIN 40 MG TABLET: 40 | 31 days supply | Qty: 31 | Fill #6

## 2016-06-02 MED FILL — JANUVIA 100 MG TABLET: 100 | 30 days supply | Qty: 30 | Fill #2

## 2016-06-27 MED FILL — FLUoxetine HCL 20 MG CAPS: 20 | 90 days supply | Qty: 90 | Fill #1

## 2016-06-30 MED FILL — JANUVIA 100 MG TABLET: 100 | 30 days supply | Qty: 30 | Fill #3

## 2016-06-30 MED FILL — SIMVASTATIN 40 MG TABLET: 40 | 31 days supply | Qty: 31 | Fill #7

## 2016-06-30 MED FILL — RAMIPRIL 10 MG CAPSULE: 10 | 31 days supply | Qty: 31 | Fill #1

## 2016-06-30 MED FILL — metFORMIN HCL 500 MG TABS: 500 | 90 days supply | Qty: 270 | Fill #1

## 2016-08-08 MED FILL — SIMVASTATIN 40 MG TABLET: 40 | 30 days supply | Qty: 30 | Fill #8

## 2016-08-08 MED FILL — JANUVIA 100 MG TABLET: 100 | 30 days supply | Qty: 30 | Fill #4

## 2016-08-08 MED FILL — RAMIPRIL 10 MG CAPSULE: 10 | 30 days supply | Qty: 30 | Fill #2

## 2016-09-08 MED FILL — RAMIPRIL 10 MG CAPSULE: 10 | 30 days supply | Qty: 30 | Fill #3

## 2016-09-08 MED FILL — JANUVIA 100 MG TABLET: 100 | 30 days supply | Qty: 30 | Fill #5

## 2016-09-08 MED FILL — SIMVASTATIN 40 MG TABLET: 40 | 30 days supply | Qty: 30 | Fill #9

## 2016-10-04 MED FILL — FLUoxetine HCL 20 MG CAPS: 20 | 90 days supply | Qty: 90 | Fill #2

## 2016-10-05 IMAGING — MR MR PROSTATE WO/W CM
23 of 53 series · 23 of 53 positions shown · IV contrast (yes)
Comparison: None.

CLINICAL DATA: Increasing PSA level wedge negative prostate
biopsies.

EXAM:
MR PROSTATE WITHOUT AND WITH CONTRAST
TECHNIQUE: Multiplanar multisequence MRI images were obtained of the pelvis
centered about the prostate. Pre and post contrast images were
obtained.
CONTRAST:  20mL MULTIHANCE GADOBENATE DIMEGLUMINE 529 MG/ML IV SOLN

[Series 3: bSSFP fat-sat · axial · 6.0mm · 0.86mm/px · 1 of 44 slices shown]
[im 1/44]
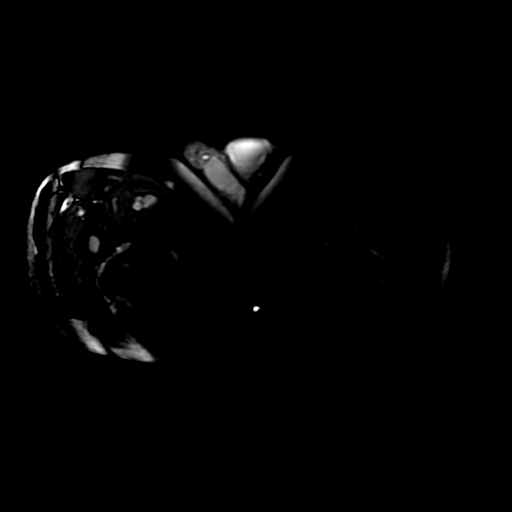

[Series 4: T1 · axial · 6.0mm · 0.86mm/px · 1 of 44 slices shown (1 of 2)]
[im 1/44]
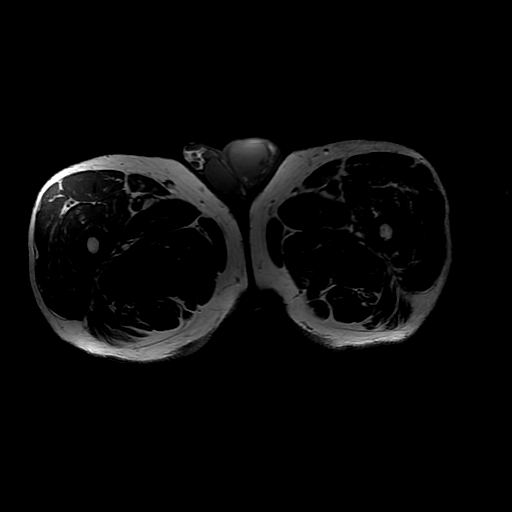

[Series 5: T2 · axial · 3.0mm · 0.29mm/px · 1 of 32 slices shown (1 of 3)]
[im 1/32]
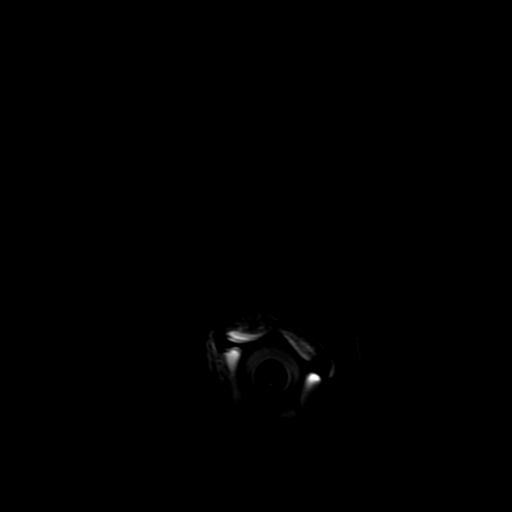

[Series 6: T1 · axial · 3.0mm · 0.29mm/px · 1 of 32 slices shown (2 of 2)]
[im 1/32]
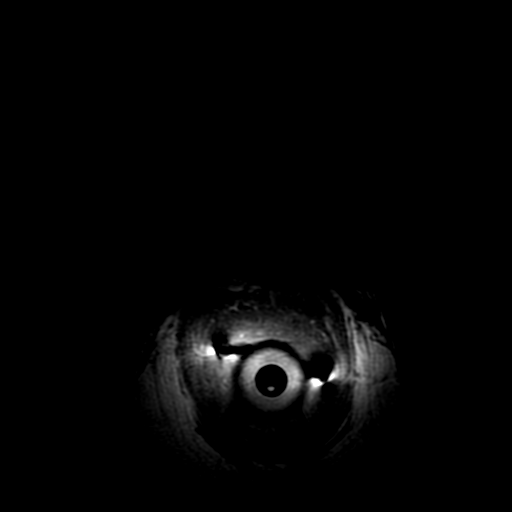

[Series 7: T2 · sagittal · 4.0mm · 0.29mm/px · 1 of 25 slices shown (2 of 3)]
[im 1/25]
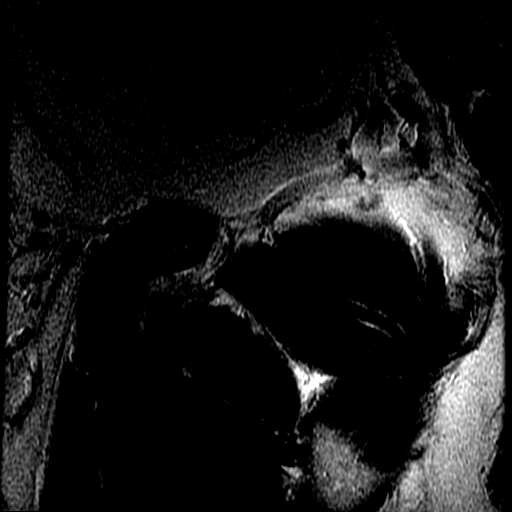

[Series 8: T2 · coronal · 4.0mm · 0.29mm/px · 1 of 20 slices shown (3 of 3)]
[im 1/20]
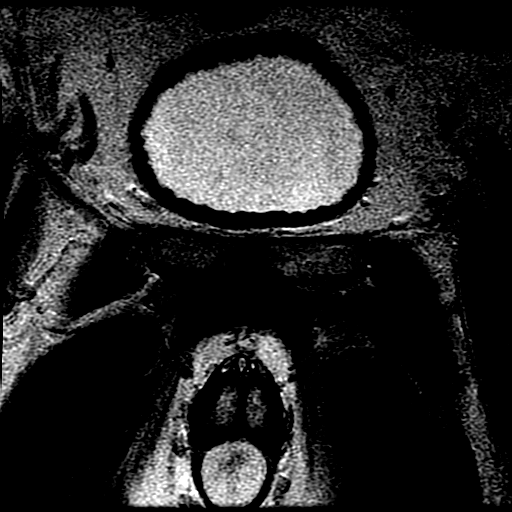

[Series 9: DWI · axial · 3.0mm · 0.59mm/px · 1 of 52 slices shown (1 of 6)]
[im 1/52]
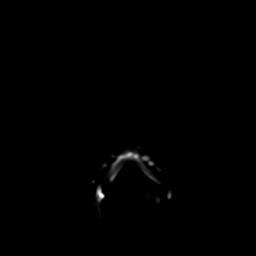

[Series 10: DWI · axial · 3.0mm · 0.59mm/px · 1 of 52 slices shown (2 of 6)]
[im 1/52]
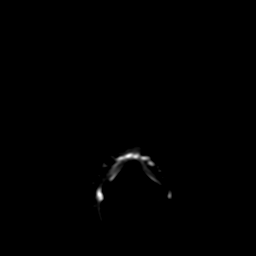

[Series 11: DWI · axial · 3.0mm · 0.59mm/px · 1 of 52 slices shown (3 of 6)]
[im 1/52]
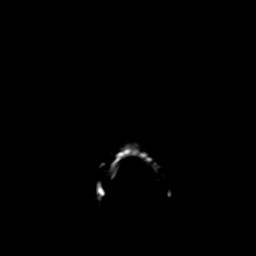

[Series 900: DWI · axial · 3.0mm · 0.59mm/px · 1 of 26 slices shown (4 of 6)]
[im 1/26]
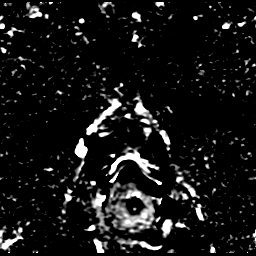

[Series 1000: DWI · axial · 3.0mm · 0.59mm/px · 1 of 26 slices shown (5 of 6)]
[im 1/26]
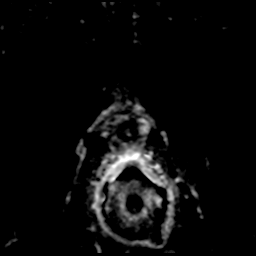

[Series 1100: DWI · axial · 3.0mm · 0.59mm/px · 1 of 26 slices shown (6 of 6)]
[im 1/26]
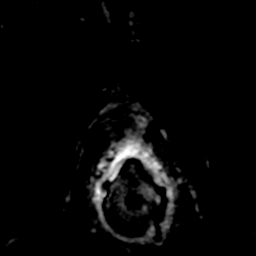

[((id)/(id)/1)-((id)/(id)/1) · axial · 3.0mm · 0.43mm/px · 1 of 68 slices shown (1 of 11)]
[im 1/68]
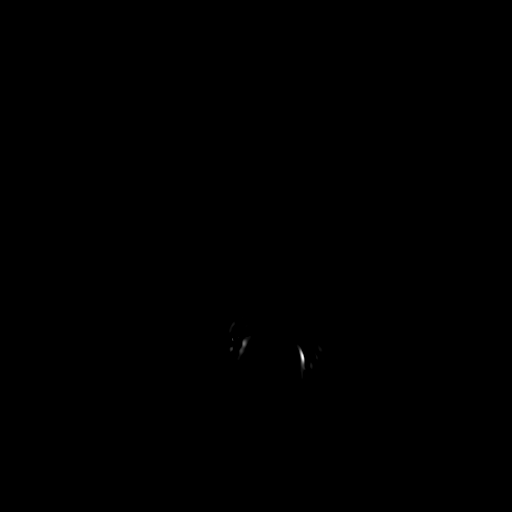

[((id)/(id)/1)-((id)/(id)/1) · axial · 3.0mm · 0.43mm/px · 1 of 68 slices shown (2 of 11)]
[im 1/68]
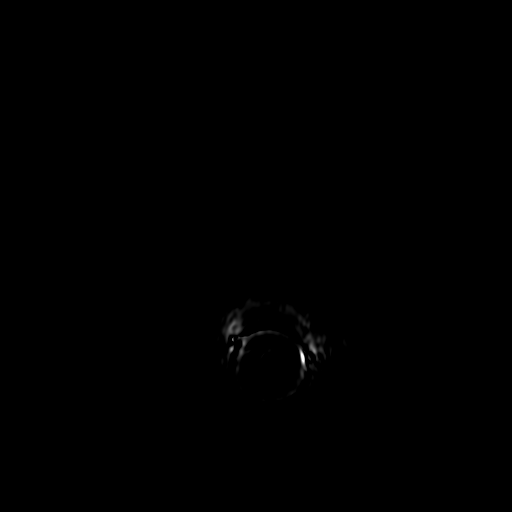

[((id)/(id)/1)-((id)/(id)/1) · axial · 3.0mm · 0.43mm/px · 1 of 68 slices shown (3 of 11)]
[im 1/68]
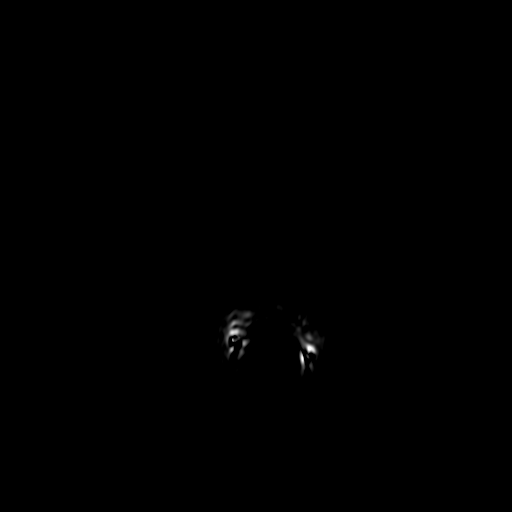

[((id)/(id)/1)-((id)/(id)/1) · axial · 3.0mm · 0.43mm/px · 1 of 68 slices shown (4 of 11)]
[im 1/68]
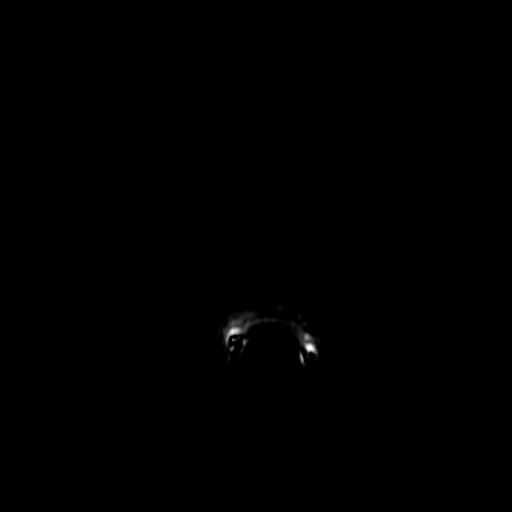

[((id)/(id)/1)-((id)/(id)/1) · axial · 3.0mm · 0.43mm/px · 1 of 68 slices shown (5 of 11)]
[im 1/68]
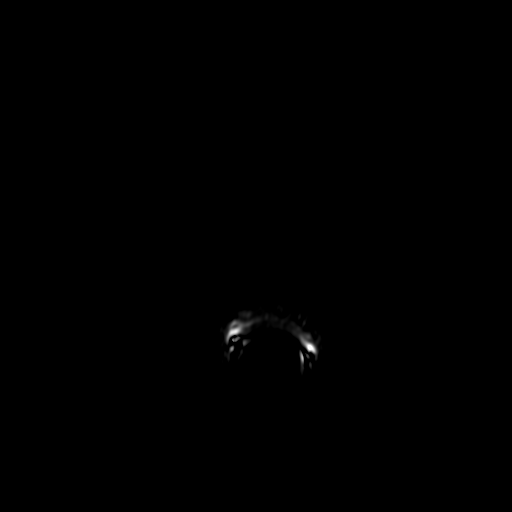

[((id)/(id)/1)-((id)/(id)/1) · axial · 3.0mm · 0.43mm/px · 1 of 68 slices shown (6 of 11)]
[im 1/68]
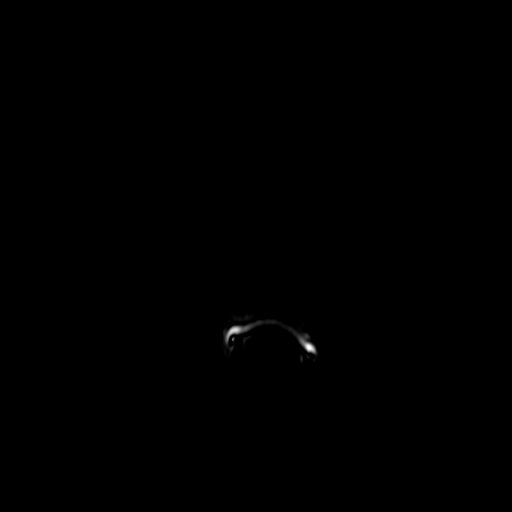

[((id)/(id)/1)-((id)/(id)/1) · axial · 3.0mm · 0.43mm/px · 1 of 68 slices shown (7 of 11)]
[im 1/68]
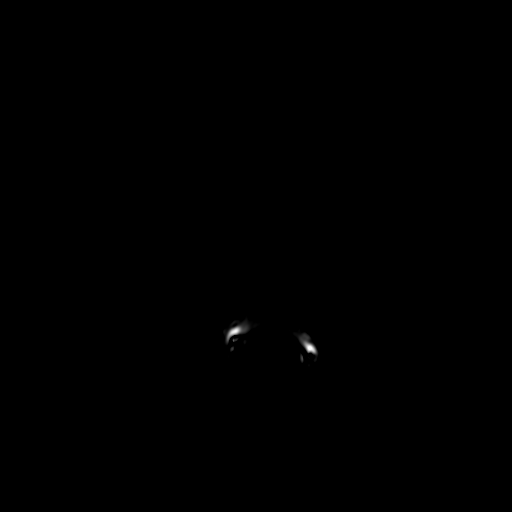

[((id)/(id)/1)-((id)/(id)/1) · axial · 3.0mm · 0.43mm/px · 1 of 68 slices shown (8 of 11)]
[im 1/68]
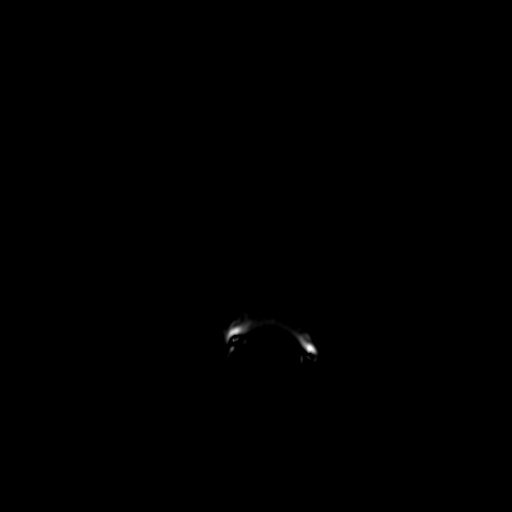

[((id)/(id)/1)-((id)/(id)/1) · axial · 3.0mm · 0.43mm/px · 1 of 68 slices shown (9 of 11)]
[im 1/68]
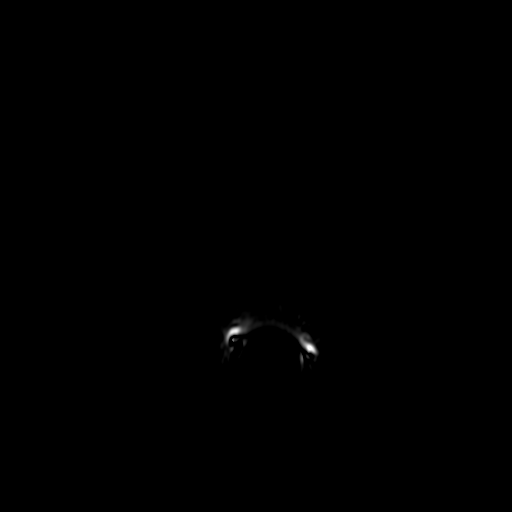

[((id)/(id)/1)-((id)/(id)/1) · axial · 3.0mm · 0.43mm/px · 1 of 68 slices shown (10 of 11)]
[im 1/68]
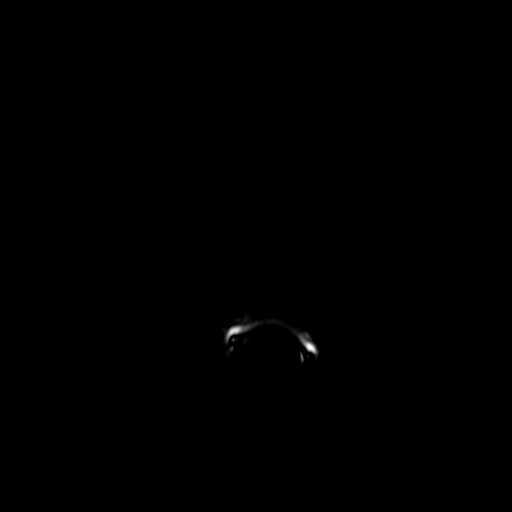

[((id)/(id)/1)-((id)/(id)/1) · axial · 3.0mm · 0.43mm/px · 1 of 68 slices shown (11 of 11)]
[im 1/68]
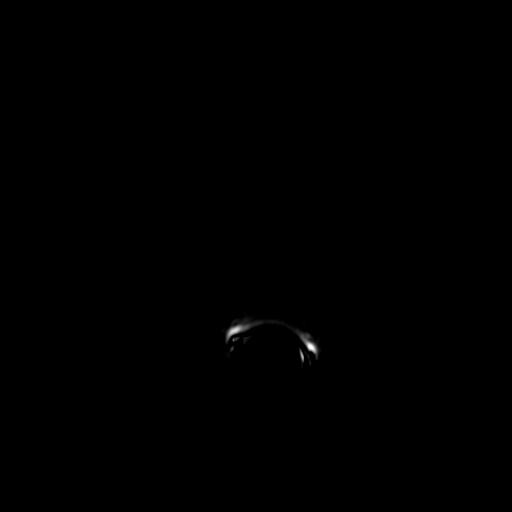

[23 of 53 positions shown; findings below may reference images not displayed]

FINDINGS: Prostate: Within a left mid gland there is a lesion measuring 10 mm
x 9 mm which demonstrates restricted diffusion (series 11, series
110 series). Small focus on low signal on T2 weighted imaging seen
on image 17, series 5. Potential mild enhancement at this site .

There is heterogeneous signal intensity within the remainder the
peripheral zone without evidence of restricted diffusion.

The prosthetic capsule is intact. There vascular bundles abnormal
periods vesicles are appear

Transcapsular spread:  Absent

Seminal vesicle involvement: Absent

Neurovascular bundle involvement: Absent

Pelvic adenopathy: No pelvic lymphadenopathy.

Bone metastasis: Absent

Other findings: Nodular transition zone. Prostate gland measures 55
by 45 mm in axial dimension 38 mm in craniocaudad dimension
IMPRESSION: 1. Focus of restricted diffusion measuring 10 mm within the left mid
gland peripheral zone is concerning for prostate adenocarcinoma. If
repeat biopsy consider recommended saturation of this region
2. No additional convincing evidence of carcinoma in the peripheral
zone.
3. Nodular transitional zone.

## 2016-10-17 MED FILL — SIMVASTATIN 40 MG TABLET: 40 | 30 days supply | Qty: 30 | Fill #10

## 2016-10-17 MED FILL — RAMIPRIL 10 MG CAPSULE: 10 | 30 days supply | Qty: 30 | Fill #4

## 2016-10-17 MED FILL — JANUVIA 100 MG TABLET: 100 | 30 days supply | Qty: 30 | Fill #6

## 2016-10-31 MED FILL — metFORMIN HCL 500 MG TABS: 500 | 90 days supply | Qty: 270 | Fill #2

## 2016-11-14 MED FILL — SIMVASTATIN 40 MG TABLET: 40 | 31 days supply | Qty: 31 | Fill #0

## 2016-11-14 MED FILL — JANUVIA 100 MG TABLET: 100 | 30 days supply | Qty: 30 | Fill #7

## 2016-11-14 MED FILL — RAMIPRIL 10 MG CAPSULE: 10 | 30 days supply | Qty: 30 | Fill #5

## 2016-12-20 MED FILL — JANUVIA 100 MG TABLET: 100 | 30 days supply | Qty: 30 | Fill #8

## 2016-12-20 MED FILL — SIMVASTATIN 40 MG TABLET: 40 | 31 days supply | Qty: 31 | Fill #1

## 2016-12-20 MED FILL — RAMIPRIL 10 MG CAPSULE: 10 | 30 days supply | Qty: 30 | Fill #6

## 2017-01-03 MED FILL — FLUoxetine HCL 20 MG CAPS: 20 | 90 days supply | Qty: 90 | Fill #3

## 2017-01-10 ENCOUNTER — Ambulatory Visit: Payer: 59 | Admitting: Podiatry

## 2017-01-18 MED FILL — SIMVASTATIN 40 MG TABLET: 40 | 31 days supply | Qty: 31 | Fill #2

## 2017-01-18 MED FILL — JANUVIA 100 MG TABLET: 100 | 30 days supply | Qty: 30 | Fill #9

## 2017-01-18 MED FILL — RAMIPRIL 10 MG CAPSULE: 10 | 30 days supply | Qty: 30 | Fill #7

## 2017-02-06 ENCOUNTER — Ambulatory Visit: Payer: Medicare Other | Admitting: Podiatry

## 2017-02-09 MED FILL — metFORMIN HCL 500 MG TABS: 500 | 90 days supply | Qty: 270 | Fill #3

## 2017-02-23 MED FILL — SIMVASTATIN 40 MG TABLET: 40 | 31 days supply | Qty: 31 | Fill #3

## 2017-02-23 MED FILL — RAMIPRIL 10 MG CAPSULE: 10 | 30 days supply | Qty: 30 | Fill #8

## 2017-02-23 MED FILL — JANUVIA 100 MG TABLET: 100 | 30 days supply | Qty: 30 | Fill #10

## 2017-03-01 MED FILL — ONE TOUCH VERIO TEST STRIP: 50 days supply | Qty: 50 | Fill #0

## 2017-03-01 MED FILL — ONE TOUCH DELICA 33G LANCET: 90 days supply | Qty: 100 | Fill #0

## 2017-03-27 MED FILL — JANUVIA 100 MG TABLET: 100 | 30 days supply | Qty: 30 | Fill #11

## 2017-03-27 MED FILL — SIMVASTATIN 40 MG TABLET: 40 | 31 days supply | Qty: 31 | Fill #4

## 2017-03-27 MED FILL — RAMIPRIL 10 MG CAPSULE: 10 | 29 days supply | Qty: 29 | Fill #9

## 2017-04-03 MED FILL — FLUoxetine HCL 20 MG CAPS: 20 | 90 days supply | Qty: 90 | Fill #0

## 2017-04-11 MED FILL — TOBRAMYCIN 0.3% EYE DROPS: 0.3 | 13 days supply | Qty: 5 | Fill #0

## 2017-05-01 MED FILL — JANUVIA 100 MG TABLET: 100 | 30 days supply | Qty: 30 | Fill #0

## 2017-05-01 MED FILL — SIMVASTATIN 40 MG TABLET: 40 | 31 days supply | Qty: 31 | Fill #5

## 2017-05-01 MED FILL — RAMIPRIL 10 MG CAPSULE: 10 | 90 days supply | Qty: 90 | Fill #0

## 2017-05-17 MED FILL — metFORMIN HCL 500 MG TABS: 500 | 90 days supply | Qty: 270 | Fill #0

## 2017-06-05 MED FILL — JANUVIA 100 MG TABLET: 100 | 30 days supply | Qty: 30 | Fill #1

## 2017-06-12 MED FILL — SIMVASTATIN 40 MG TABLET: 40 | 31 days supply | Qty: 31 | Fill #0

## 2017-06-30 MED FILL — PEG-3350 SOLUTION: 420 | 1 days supply | Qty: 4000 | Fill #0

## 2017-07-10 MED FILL — FLUoxetine HCL 20 MG CAPS: 20 | 90 days supply | Qty: 90 | Fill #1

## 2017-07-10 MED FILL — JANUVIA 100 MG TABLET: 100 | 30 days supply | Qty: 30 | Fill #2

## 2017-07-18 MED FILL — SIMVASTATIN 40 MG TABLET: 40 | 31 days supply | Qty: 31 | Fill #1

## 2017-08-07 MED FILL — RAMIPRIL 10 MG CAPSULE: 10 | 90 days supply | Qty: 90 | Fill #1

## 2017-08-09 MED FILL — JANUVIA 100 MG TABLET: 100 | 30 days supply | Qty: 30 | Fill #3

## 2017-08-16 MED FILL — metFORMIN HCL 500 MG TABS: 500 | 90 days supply | Qty: 270 | Fill #1

## 2017-08-16 MED FILL — SIMVASTATIN 40 MG TABLET: 40 | 30 days supply | Qty: 30 | Fill #2

## 2017-09-07 MED FILL — JANUVIA 100 MG TABLET: 100 | 30 days supply | Qty: 30 | Fill #4

## 2017-09-20 MED FILL — SIMVASTATIN 40 MG TABLET: 40 | 30 days supply | Qty: 30 | Fill #3

## 2017-10-09 MED FILL — FLUoxetine HCL 20 MG CAPS: 20 | 90 days supply | Qty: 90 | Fill #2

## 2017-10-09 MED FILL — JANUVIA 100 MG TABLET: 100 | 30 days supply | Qty: 30 | Fill #5

## 2017-10-26 MED FILL — SIMVASTATIN 40 MG TABLET: 40 | 30 days supply | Qty: 30 | Fill #4

## 2017-11-14 MED FILL — RAMIPRIL 10 MG CAPSULE: 10 | 90 days supply | Qty: 90 | Fill #2

## 2017-11-14 MED FILL — JANUVIA 100 MG TABLET: 100 | 30 days supply | Qty: 30 | Fill #6

## 2017-11-29 MED FILL — SIMVASTATIN 40 MG TABLET: 40 | 30 days supply | Qty: 30 | Fill #5

## 2017-11-29 MED FILL — metFORMIN HCL 500 MG TABS: 500 | 90 days supply | Qty: 270 | Fill #2

## 2017-12-14 MED FILL — JANUVIA 100 MG TABLET: 100 | 30 days supply | Qty: 30 | Fill #7

## 2017-12-15 MED FILL — AMLODIPINE 2.5 MG TABLET: 2.5 | 90 days supply | Qty: 90 | Fill #0

## 2018-01-04 MED FILL — SIMVASTATIN 40 MG TABLET: 40 | 31 days supply | Qty: 31 | Fill #0

## 2018-01-12 MED FILL — FLUoxetine HCL 20 MG CAPS: 20 | 90 days supply | Qty: 90 | Fill #0

## 2018-01-12 MED FILL — JANUVIA 100 MG TABLET: 100 | 30 days supply | Qty: 30 | Fill #8

## 2018-02-05 MED FILL — SIMVASTATIN 40 MG TABLET: 40 | 31 days supply | Qty: 31 | Fill #1

## 2018-02-12 MED FILL — RAMIPRIL 10 MG CAPSULE: 10 | 90 days supply | Qty: 90 | Fill #3

## 2018-02-12 MED FILL — JANUVIA 100 MG TABLET: 100 | 30 days supply | Qty: 30 | Fill #9

## 2018-02-16 MED FILL — AMLODIPINE BESYLATE 5 MG TA: 5 | 90 days supply | Qty: 90 | Fill #0

## 2018-02-16 MED FILL — SM CLEARLAX POWDER: 30 days supply | Qty: 510 | Fill #0

## 2018-02-16 MED FILL — STOOL SOFTENER-LAXATIVE TAB: 50-8.6 | 50 days supply | Qty: 100 | Fill #0

## 2018-03-05 MED FILL — SIMVASTATIN 40 MG TABLET: 40 | 31 days supply | Qty: 31 | Fill #2

## 2018-03-05 MED FILL — metFORMIN HCL 500 MG TABS: 500 | 30 days supply | Qty: 90 | Fill #0

## 2018-03-15 MED FILL — SM CLEARLAX POWDER: 30 days supply | Qty: 510 | Fill #1

## 2018-03-15 MED FILL — JANUVIA 100 MG TABLET: 100 | 30 days supply | Qty: 30 | Fill #10

## 2018-04-03 MED FILL — ONE TOUCH VERIO TEST STRIP: 50 days supply | Qty: 50 | Fill #0

## 2018-04-09 MED FILL — STOOL SOFTENER-LAXATIVE TAB: 50-8.6 | 50 days supply | Qty: 100 | Fill #1

## 2018-04-09 MED FILL — metFORMIN HCL 500 MG TABS: 500 | 30 days supply | Qty: 90 | Fill #1

## 2018-04-09 MED FILL — SIMVASTATIN 40 MG TABLET: 40 | 31 days supply | Qty: 31 | Fill #3

## 2018-04-16 MED FILL — SM CLEARLAX POWDER: 30 days supply | Qty: 510 | Fill #2

## 2018-04-16 MED FILL — JANUVIA 100 MG TABLET: 100 | 30 days supply | Qty: 30 | Fill #11

## 2018-04-16 MED FILL — FLUoxetine HCL 20 MG CAPS: 20 | 90 days supply | Qty: 90 | Fill #1

## 2018-05-11 MED FILL — ONE TOUCH VERIO TEST STRIP: 50 days supply | Qty: 50 | Fill #1

## 2018-05-15 MED FILL — metFORMIN HCL 500 MG TABS: 500 | 30 days supply | Qty: 90 | Fill #2

## 2018-05-15 MED FILL — SM CLEARLAX POWDER: 30 days supply | Qty: 510 | Fill #3

## 2018-05-15 MED FILL — SIMVASTATIN 40 MG TABLET: 40 | 31 days supply | Qty: 31 | Fill #4

## 2018-05-15 MED FILL — JANUVIA 100 MG TABLET: 100 | 30 days supply | Qty: 30 | Fill #0

## 2018-05-15 MED FILL — AMLODIPINE BESYLATE 5 MG TA: 5 | 90 days supply | Qty: 90 | Fill #1

## 2018-05-16 MED FILL — RAMIPRIL 10 MG CAPSULE: 10 | 90 days supply | Qty: 90 | Fill #0

## 2018-05-28 MED FILL — SM STOOL SOFTENER-LAXATIVE: 8.6-50 | 50 days supply | Qty: 100 | Fill #2

## 2018-06-15 MED FILL — SIMVASTATIN 40 MG TABLET: 40 | 31 days supply | Qty: 31 | Fill #5

## 2018-06-15 MED FILL — metFORMIN HCL 500 MG TABS: 500 | 30 days supply | Qty: 90 | Fill #3

## 2018-06-15 MED FILL — JANUVIA 100 MG TABLET: 100 | 30 days supply | Qty: 30 | Fill #1

## 2018-06-18 MED FILL — SM CLEARLAX POWDER: 30 days supply | Qty: 510 | Fill #4

## 2018-07-17 MED FILL — FLUoxetine HCL 20 MG CAPS: 20 | 90 days supply | Qty: 90 | Fill #2

## 2018-07-17 MED FILL — metFORMIN HCL 500 MG TABS: 500 | 30 days supply | Qty: 90 | Fill #4

## 2018-07-17 MED FILL — JANUVIA 100 MG TABLET: 100 | 30 days supply | Qty: 30 | Fill #2

## 2018-07-17 MED FILL — SIMVASTATIN 40 MG TABLET: 40 | 31 days supply | Qty: 31 | Fill #0

## 2018-07-17 MED FILL — SENNA PLUS 8.6-50 MG TABS: 8.6-50 | 50 days supply | Qty: 100 | Fill #3

## 2018-07-26 MED FILL — SM CLEARLAX POWDER: 17 | 30 days supply | Qty: 510 | Fill #5

## 2018-08-14 MED FILL — JANUVIA 100 MG TABLET: 100 | 30 days supply | Qty: 30 | Fill #3

## 2018-08-14 MED FILL — metFORMIN HCL 500 MG TABS: 500 | 30 days supply | Qty: 90 | Fill #5

## 2018-08-14 MED FILL — RAMIPRIL 10 MG CAPSULE: 10 | 90 days supply | Qty: 90 | Fill #1

## 2018-08-14 MED FILL — SIMVASTATIN 40 MG TABLET: 40 | 31 days supply | Qty: 31 | Fill #1

## 2018-08-14 MED FILL — AMLODIPINE BESYLATE 5 MG TA: 5 | 90 days supply | Qty: 90 | Fill #2

## 2018-08-31 MED FILL — SM CLEARLAX POWDER: 17 | 30 days supply | Qty: 510 | Fill #6

## 2018-08-31 MED FILL — SENNA PLUS 8.6-50 MG TABS: 8.6-50 | 50 days supply | Qty: 100 | Fill #4

## 2018-09-14 MED FILL — metFORMIN HCL 500 MG TABS: 500 | 30 days supply | Qty: 90 | Fill #6

## 2018-09-17 MED FILL — SIMVASTATIN 40 MG TABLET: 40 | 30 days supply | Qty: 30 | Fill #2

## 2018-09-18 MED FILL — JANUVIA 100 MG TABLET: 100 | 30 days supply | Qty: 30 | Fill #4

## 2018-10-09 MED FILL — SM CLEARLAX POWDER: 17 | 30 days supply | Qty: 510 | Fill #7

## 2018-10-11 MED FILL — FLUoxetine HCL 20 MG CAPS: 20 | 90 days supply | Qty: 90 | Fill #3

## 2018-10-17 MED FILL — SIMVASTATIN 40 MG TABLET: 40 | 30 days supply | Qty: 30 | Fill #3

## 2018-10-17 MED FILL — metFORMIN HCL 500 MG TABS: 500 | 30 days supply | Qty: 90 | Fill #7

## 2018-10-17 MED FILL — JANUVIA 100 MG TABLET: 100 | 30 days supply | Qty: 30 | Fill #5

## 2018-10-22 MED FILL — STIMULANT LAXATIVE 8.6-50 M: 8.6-50 | 50 days supply | Qty: 100 | Fill #5

## 2018-10-31 MED FILL — ONE TOUCH VERIO TEST STRIP: 50 days supply | Qty: 50 | Fill #2

## 2018-11-13 MED FILL — AMLODIPINE BESYLATE 5 MG TA: 5 | 90 days supply | Qty: 90 | Fill #3

## 2018-11-13 MED FILL — RAMIPRIL 10 MG CAPSULE: 10 | 90 days supply | Qty: 90 | Fill #2

## 2018-11-13 MED FILL — SIMVASTATIN 40 MG TABLET: 40 | 30 days supply | Qty: 30 | Fill #4

## 2018-11-13 MED FILL — metFORMIN HCL 500 MG TABS: 500 | 30 days supply | Qty: 90 | Fill #8

## 2018-11-13 MED FILL — JANUVIA 100 MG TABLET: 100 | 30 days supply | Qty: 30 | Fill #6

## 2018-11-13 MED FILL — SM CLEARLAX POWDER: 17 | 30 days supply | Qty: 510 | Fill #8

## 2018-12-10 MED FILL — STIMULANT LAXATIVE 8.6-50 M: 8.6-50 | 50 days supply | Qty: 100 | Fill #6

## 2018-12-14 MED FILL — SIMVASTATIN 40 MG TABLET: 40 | 30 days supply | Qty: 30 | Fill #5

## 2018-12-14 MED FILL — metFORMIN HCL 500 MG TABS: 500 | 30 days supply | Qty: 90 | Fill #9

## 2018-12-14 MED FILL — JANUVIA 100 MG TABLET: 100 | 30 days supply | Qty: 30 | Fill #7

## 2018-12-19 MED FILL — SM CLEARLAX POWDER: 17 | 30 days supply | Qty: 510 | Fill #9

## 2019-01-15 MED FILL — JANUVIA 100 MG TABLET: 100 | 30 days supply | Qty: 30 | Fill #8

## 2019-01-15 MED FILL — metFORMIN HCL 500 MG TABS: 500 | 30 days supply | Qty: 90 | Fill #10

## 2019-01-15 MED FILL — ONE TOUCH VERIO TEST STRIP: 50 days supply | Qty: 50 | Fill #3

## 2019-01-15 MED FILL — FLUoxetine HCL 20 MG CAPS: 20 | 90 days supply | Qty: 90 | Fill #0

## 2019-01-22 MED FILL — SM CLEARLAX POWDER: 17 | 30 days supply | Qty: 510 | Fill #10

## 2019-01-22 MED FILL — STOOL SOFTENER/LAXATIVE 50-: 50-8.6 | 50 days supply | Qty: 100 | Fill #7

## 2019-01-23 MED FILL — SIMVASTATIN 40 MG TABLET: 40 | 31 days supply | Qty: 31 | Fill #0

## 2019-02-13 MED FILL — RAMIPRIL 10 MG CAPSULE: 10 | 90 days supply | Qty: 90 | Fill #3

## 2019-02-13 MED FILL — JANUVIA 100 MG TABLET: 100 | 30 days supply | Qty: 30 | Fill #9

## 2019-02-13 MED FILL — metFORMIN HCL 500 MG TABS: 500 | 30 days supply | Qty: 90 | Fill #11

## 2019-02-14 MED FILL — AMLODIPINE BESYLATE 5 MG TA: 5 | 90 days supply | Qty: 90 | Fill #0

## 2019-02-20 MED FILL — SIMVASTATIN 40 MG TABLET: 40 | 31 days supply | Qty: 31 | Fill #1

## 2019-02-21 MED FILL — STOOL SOFTENER/LAXATIVE 50-: 50-8.6 | 50 days supply | Qty: 100 | Fill #0

## 2019-02-21 MED FILL — SM CLEARLAX POWDER: 17 | 30 days supply | Qty: 510 | Fill #0

## 2019-03-19 MED FILL — SIMVASTATIN 40 MG TABLET: 40 | 31 days supply | Qty: 31 | Fill #2

## 2019-03-19 MED FILL — JANUVIA 100 MG TABLET: 100 | 30 days supply | Qty: 30 | Fill #10

## 2019-03-19 MED FILL — metFORMIN HCL 500 MG TABS: 500 | 90 days supply | Qty: 270 | Fill #0

## 2019-04-08 MED FILL — ONE TOUCH VERIO TEST STRIP: 50 days supply | Qty: 50 | Fill #0

## 2019-04-09 MED FILL — ONETOUCH DELICA PLUS LANCET: 90 days supply | Qty: 100 | Fill #0

## 2019-04-15 MED FILL — FLUoxetine HCL 20 MG CAPS: 20 | 90 days supply | Qty: 90 | Fill #1

## 2019-04-22 MED FILL — SIMVASTATIN 40 MG TABS: 40 | 31 days supply | Qty: 31 | Fill #3

## 2019-04-22 MED FILL — JANUVIA 100 MG TABLET: 100 | 30 days supply | Qty: 30 | Fill #11

## 2019-05-07 MED FILL — SM CLEARLAX POWDER: 17 | 30 days supply | Qty: 510 | Fill #2

## 2019-05-15 MED FILL — STIMULANT LAXATIVE 8.6-50 M: 8.6-50 | 50 days supply | Qty: 100 | Fill #1

## 2019-05-20 MED FILL — AMLODIPINE BESYLATE 5 MG TA: 5 | 90 days supply | Qty: 90 | Fill #1

## 2019-05-20 MED FILL — RAMIPRIL 10 MG CAPSULE: 10 | 90 days supply | Qty: 90 | Fill #0

## 2019-05-20 MED FILL — JANUVIA 100 MG TABLET: 100 | 30 days supply | Qty: 30 | Fill #0

## 2019-05-29 MED FILL — SIMVASTATIN 40 MG TABS: 40 | 31 days supply | Qty: 31 | Fill #4

## 2019-06-10 MED FILL — SM CLEARLAX POWDER: 17 | 30 days supply | Qty: 510 | Fill #3

## 2019-06-21 MED FILL — metFORMIN HCL 500 MG TABS: 500 | 90 days supply | Qty: 270 | Fill #1

## 2019-06-21 MED FILL — JANUVIA 100 MG TABLET: 100 | 30 days supply | Qty: 30 | Fill #1

## 2019-07-01 MED FILL — SIMVASTATIN 40 MG TABS: 40 | 31 days supply | Qty: 31 | Fill #5

## 2019-07-10 MED FILL — SM CLEARLAX POWDER: 17 | 30 days supply | Qty: 510 | Fill #4

## 2019-07-10 MED FILL — FLUoxetine HCL 20 MG CAPS: 20 | 90 days supply | Qty: 90 | Fill #2

## 2019-07-10 MED FILL — STOOL SOFTENER/LAXATIVE 50-: 50-8.6 | 50 days supply | Qty: 100 | Fill #2

## 2019-07-19 MED FILL — FLUoxetine HCL 40 MG CAPS: 40 | 90 days supply | Qty: 90 | Fill #0

## 2019-07-22 MED FILL — JANUVIA 100 MG TABLET: 100 | 30 days supply | Qty: 30 | Fill #2

## 2019-07-30 MED FILL — SIMVASTATIN 40 MG TABS: 40 | 31 days supply | Qty: 31 | Fill #0

## 2019-08-02 ENCOUNTER — Ambulatory Visit (INDEPENDENT_AMBULATORY_CARE_PROVIDER_SITE_OTHER): Payer: Medicare Other | Admitting: Podiatry

## 2019-08-02 ENCOUNTER — Other Ambulatory Visit: Payer: Self-pay

## 2019-08-02 ENCOUNTER — Encounter: Payer: Self-pay | Admitting: Podiatry

## 2019-08-02 VITALS — BP 178/93 | HR 80 | Temp 97.5°F

## 2019-08-02 DIAGNOSIS — L600 Ingrowing nail: Secondary | ICD-10-CM | POA: Diagnosis not present

## 2019-08-02 DIAGNOSIS — M79674 Pain in right toe(s): Secondary | ICD-10-CM | POA: Diagnosis not present

## 2019-08-02 DIAGNOSIS — B351 Tinea unguium: Secondary | ICD-10-CM

## 2019-08-02 DIAGNOSIS — M79675 Pain in left toe(s): Secondary | ICD-10-CM | POA: Diagnosis not present

## 2019-08-02 DIAGNOSIS — Z794 Long term (current) use of insulin: Secondary | ICD-10-CM

## 2019-08-02 DIAGNOSIS — E119 Type 2 diabetes mellitus without complications: Secondary | ICD-10-CM | POA: Diagnosis not present

## 2019-08-02 NOTE — Patient Instructions (Signed)
Ingrown Toenail An ingrown toenail occurs when the corner or sides of a toenail grow into the surrounding skin. This causes discomfort and pain. The big toe is most commonly affected, but any of the toes can be affected. If an ingrown toenail is not treated, it can become infected. What are the causes? This condition may be caused by:  Wearing shoes that are too small or tight.  An injury, such as stubbing your toe or having your toe stepped on.  Improper cutting or care of your toenails.  Having nail or foot abnormalities that were present from birth (congenital abnormalities), such as having a nail that is too big for your toe. What increases the risk? The following factors may make you more likely to develop ingrown toenails:  Age. Nails tend to get thicker with age, so ingrown nails are more common among older people.  Cutting your toenails incorrectly, such as cutting them very short or cutting them unevenly. An ingrown toenail is more likely to get infected if you have:  Diabetes.  Blood flow (circulation) problems. What are the signs or symptoms? Symptoms of an ingrown toenail may include:  Pain, soreness, or tenderness.  Redness.  Swelling.  Hardening of the skin that surrounds the toenail. Signs that an ingrown toenail may be infected include:  Fluid or pus.  Symptoms that get worse instead of better. How is this diagnosed? An ingrown toenail may be diagnosed based on your medical history, your symptoms, and a physical exam. If you have fluid or blood coming from your toenail, a sample may be collected to test for the specific type of bacteria that is causing the infection. How is this treated? Treatment depends on how severe your ingrown toenail is. You may be able to care for your toenail at home.  If you have an infection, you may be prescribed antibiotic medicines.  If you have fluid or pus draining from your toenail, your health care provider may drain  it.  If you have trouble walking, you may be given crutches to use.  If you have a severe or infected ingrown toenail, you may need a procedure to remove part or all of the nail. Follow these instructions at home: Foot care   Do not pick at your toenail or try to remove it yourself.  Soak your foot in warm, soapy water. Do this for 20 minutes, 3 times a day, or as often as told by your health care provider. This helps to keep your toe clean and keep your skin soft.  Wear shoes that fit well and are not too tight. Your health care provider may recommend that you wear open-toed shoes while you heal.  Trim your toenails regularly and carefully. Cut your toenails straight across to prevent injury to the skin at the corners of the toenail. Do not cut your nails in a curved shape.  Keep your feet clean and dry to help prevent infection. Medicines  Take over-the-counter and prescription medicines only as told by your health care provider.  If you were prescribed an antibiotic, take it as told by your health care provider. Do not stop taking the antibiotic even if you start to feel better. Activity  Return to your normal activities as told by your health care provider. Ask your health care provider what activities are safe for you.  Avoid activities that cause pain. General instructions  If your health care provider told you to use crutches to help you move around, use them   as instructed.  Keep all follow-up visits as told by your health care provider. This is important. Contact a health care provider if:  You have more redness, swelling, pain, or other symptoms that do not improve with treatment.  You have fluid, blood, or pus coming from your toenail. Get help right away if:  You have a red streak on your skin that starts at your foot and spreads up your leg.  You have a fever. Summary  An ingrown toenail occurs when the corner or sides of a toenail grow into the surrounding  skin. This causes discomfort and pain. The big toe is most commonly affected, but any of the toes can be affected.  If an ingrown toenail is not treated, it can become infected.  Fluid or pus draining from your toenail is a sign of infection. Your health care provider may need to drain it. You may be given antibiotics to treat the infection.  Trimming your toenails regularly and properly can help you prevent an ingrown toenail. This information is not intended to replace advice given to you by your health care provider. Make sure you discuss any questions you have with your health care provider. Document Revised: 08/17/2018 Document Reviewed: 01/11/2017 Elsevier Patient Education  2020 [employer]  Diabetes Mellitus and Foot Care Foot care is an important part of your health, especially when you have diabetes. Diabetes may cause you to have problems because of poor blood flow (circulation) to your feet and legs, which can cause your skin to:  Become thinner and drier.  Break more easily.  Heal more slowly.  Peel and crack. You may also have nerve damage (neuropathy) in your legs and feet, causing decreased feeling in them. This means that you may not notice minor injuries to your feet that could lead to more serious problems. Noticing and addressing any potential problems early is the best way to prevent future foot problems. How to care for your feet Foot hygiene  Wash your feet daily with warm water and mild soap. Do not use hot water. Then, pat your feet and the areas between your toes until they are completely dry. Do not soak your feet as this can dry your skin.  Trim your toenails straight across. Do not dig under them or around the cuticle. File the edges of your nails with an emery board or nail file.  Apply a moisturizing lotion or petroleum jelly to the skin on your feet and to dry, brittle toenails. Use lotion that does not contain alcohol and is unscented. Do not apply  lotion between your toes. Shoes and socks  Wear clean socks or stockings every day. Make sure they are not too tight. Do not wear knee-high stockings since they may decrease blood flow to your legs.  Wear shoes that fit properly and have enough cushioning. Always look in your shoes before you put them on to be sure there are no objects inside.  To break in new shoes, wear them for just a few hours a day. This prevents injuries on your feet. Wounds, scrapes, corns, and calluses  Check your feet daily for blisters, cuts, bruises, sores, and redness. If you cannot see the bottom of your feet, use a mirror or ask someone for help.  Do not cut corns or calluses or try to remove them with medicine.  If you find a minor scrape, cut, or break in the skin on your feet, keep it and the skin around it clean and   dry. You may clean these areas with mild soap and water. Do not clean the area with peroxide, alcohol, or iodine.  If you have a wound, scrape, corn, or callus on your foot, look at it several times a day to make sure it is healing and not infected. Check for: ? Redness, swelling, or pain. ? Fluid or blood. ? Warmth. ? Pus or a bad smell. General instructions  Do not cross your legs. This may decrease blood flow to your feet.  Do not use heating pads or hot water bottles on your feet. They may burn your skin. If you have lost feeling in your feet or legs, you may not know this is happening until it is too late.  Protect your feet from hot and cold by wearing shoes, such as at the beach or on hot pavement.  Schedule a complete foot exam at least once a year (annually) or more often if you have foot problems. If you have foot problems, report any cuts, sores, or bruises to your health care provider immediately. Contact a health care provider if:  You have a medical condition that increases your risk of infection and you have any cuts, sores, or bruises on your feet.  You have an injury  that is not healing.  You have redness on your legs or feet.  You feel burning or tingling in your legs or feet.  You have pain or cramps in your legs and feet.  Your legs or feet are numb.  Your feet always feel cold.  You have pain around a toenail. Get help right away if:  You have a wound, scrape, corn, or callus on your foot and: ? You have pain, swelling, or redness that gets worse. ? You have fluid or blood coming from the wound, scrape, corn, or callus. ? Your wound, scrape, corn, or callus feels warm to the touch. ? You have pus or a bad smell coming from the wound, scrape, corn, or callus. ? You have a fever. ? You have a red line going up your leg. Summary  Check your feet every day for cuts, sores, red spots, swelling, and blisters.  Moisturize feet and legs daily.  Wear shoes that fit properly and have enough cushioning.  If you have foot problems, report any cuts, sores, or bruises to your health care provider immediately.  Schedule a complete foot exam at least once a year (annually) or more often if you have foot problems. This information is not intended to replace advice given to you by your health care provider. Make sure you discuss any questions you have with your health care provider. Document Revised: 01/16/2019 Document Reviewed: 05/27/2016 Elsevier Patient Education  2020 Elsevier Inc.  

## 2019-08-05 NOTE — Progress Notes (Signed)
Subjective:  Patient ID: [name], male    DOB: [birth year],  MRN: [id number]  Chief Complaint  Patient presents with  . Diabetes    A1C  6.2,  daily sugars average around 100, diabetic foot exam  . Nail Problem    bilateral thick painful toenails  . Ingrown Toenail    right great toenail    69 y.o. male presents with the above complaint.  Patient presents with a complaint of right lateral hallux ingrown toenail.  Patient states is painful to walk on.  Patient is a diabetic with last A1c of 6.2.  His sugars are well controlled.  He also has thickened elongated mycotic toenails that are painful in nature.  Patient states his pain when ambulating.  He gets pain from the toenails constantly.  The wife has been trim them but is not able to now.  Overall he would like to know if this could be debrided down and if the ingrown could be removed.  He denies any other acute complaints.  He has not seen anyone else for this.  He has not tried any other treatment options.   Review of Systems: Negative except as noted in the HPI. Denies N/V/F/Ch.  Past Medical History:  Diagnosis Date  . Arthritis    back, neck, fingers  . Cancer [facility])    prostate cancer -dx. bx.   . Chronic constipation   . Elevated PSA   . Frequency of urination   . Hearing loss    no aids used  . Hemorrhoids    occ. flare ups mostly springtime.  Marland Kitchen History of gastroesophageal reflux (GERD)   . History of ileus    POST OP 2008 SURGERY-- RESOLVED WITHOUT SURGICAL INTERVENTION  . History of jaundice as a child    TEEN-- RESOLVED  . Hyperlipidemia   . Hypertension   . Neuromuscular disorder ([place])    improved numbness/tingling-remains slight tingling/numbness left index finger  . Nocturia   . OSA on CPAP    cpap settings 10  . Shortness of breath on exertion   . Type 2 diabetes mellitus ([place])   . Wears glasses     Current Outpatient Medications:  .  amLODipine (NORVASC) 5 MG tablet, Take 5 mg by mouth daily.,  Disp: , Rfl:  .  Aspirin Buf,CaCarb-MgCarb-MgO, 81 MG TABS, Take by mouth., Disp: , Rfl:  .  ciprofloxacin (CIPRO) 500 MG tablet, Take 1 tablet (500 mg total) by mouth 2 (two) times daily. Start day prior to office visit for foley removal, Disp: 6 tablet, Rfl: 0 .  docusate sodium (COLACE) 100 MG capsule, Take 300 mg by mouth every morning., Disp: , Rfl:  .  FLUoxetine (PROZAC) 20 MG tablet, Take 20 mg by mouth every evening. , Disp: , Rfl:  .  FLUoxetine (PROZAC) 40 MG capsule, , Disp: , Rfl:  .  HYDROcodone-acetaminophen (NORCO) 5-325 MG per tablet, Take 1-2 tablets by mouth every 6 (six) hours as needed., Disp: 30 tablet, Rfl: 0 .  metFORMIN (GLUCOPHAGE) 500 MG tablet, Take 500-1,000 mg by mouth 2 (two) times daily with a meal. One tab in AM and Two tabs in PM, Disp: , Rfl:  .  ramipril (ALTACE) 10 MG tablet, Take 10 mg by mouth every morning. , Disp: , Rfl:  .  senna (SENOKOT) 8.6 MG tablet, Take 4 tablets by mouth at bedtime., Disp: , Rfl:  .  simvastatin (ZOCOR) 40 MG tablet, Take 40 mg by mouth every evening., Disp: ,  Rfl:  .  sitaGLIPtin (JANUVIA) 100 MG tablet, Take 100 mg by mouth every morning., Disp: , Rfl:   Social History   Tobacco Use  Smoking Status Never Smoker  Smokeless Tobacco Never Used    No Known Allergies Objective:   Vitals:   08/02/19 1320  BP: (!) 178/93  Pulse: 80  Temp: (!) 97.5 F (36.4 C)   There is no height or weight on file to calculate BMI. Constitutional Well developed. Well nourished.  Vascular Dorsalis pedis pulses palpable bilaterally. Posterior tibial pulses palpable bilaterally. Capillary refill normal to all digits.  No cyanosis or clubbing noted. Pedal hair growth normal.  Neurologic Normal speech. Oriented to person, place, and time. Epicritic sensation to light touch grossly present bilaterally.  Dermatologic Painful ingrowing nail at lateral nail borders of the hallux nail right.  Nail Exam: Pt has thick disfigured discolored  nails with subungual debris noted bilateral entire nail hallux through fifth toenails No other open wounds. No skin lesions.  Orthopedic: Normal joint ROM without pain or crepitus bilaterally. No visible deformities. No bony tenderness.   Radiographs: None Assessment:   1. Pain due to onychomycosis of toenails of both feet   2. Ingrown toenail of right foot   3. Type 2 diabetes mellitus without complication, with long-term current use of insulin ([place])    Plan:  Patient was evaluated and treated and all questions answered.  Ingrown Nail, right -Patient elects to proceed with minor surgery to remove ingrown toenail removal today. Consent reviewed and signed by patient. -Ingrown nail excised. See procedure note. -Educated on post-procedure care including soaking. Written instructions provided and reviewed. -Patient to follow up in 2 weeks for nail check. -Given that patient is diabetic he is at a high risk for wound healing as well as complications.  I explained this is patient extensive detail.  I explained to them that there is a chance that he is a high risk of having wound or complication as well as loss of toe or foot.  Patient states understanding would like to proceed with the procedures listed below.  Procedure: Excision of Ingrown Toenail Location: Right 1st toe lateral nail borders. Anesthesia: Lidocaine 1% plain; 1.5 mL and Marcaine 0.5% plain; 1.5 mL, digital block. Skin Prep: Betadine. Dressing: Silvadene; telfa; dry, sterile, compression dressing. Technique: Following skin prep, the toe was exsanguinated and a tourniquet was secured at the base of the toe. The affected nail border was freed, split with a nail splitter, and excised. Chemical matrixectomy was then performed with phenol and irrigated out with alcohol. The tourniquet was then removed and sterile dressing applied. Disposition: Patient tolerated procedure well. Patient to return in 2 weeks for follow-up.    Onychomycosis with pain  -Nails palliatively debrided as below. -Educated on self-care  Procedure: Nail Debridement Rationale: pain  Type of Debridement: manual, sharp debridement. Instrumentation: Nail nipper, rotary burr. Number of Nails: 10  Procedures and Treatment: Consent by patient was obtained for treatment procedures. The patient understood the discussion of treatment and procedures well. All questions were answered thoroughly reviewed. Debridement of mycotic and hypertrophic toenails, 1 through 5 bilateral and clearing of subungual debris. No ulceration, no infection noted.  Return Visit-Office Procedure: Patient instructed to return to the office for a follow up visit 3 months for continued evaluation and treatment.  Boneta Lucks, DPM   Return in about 3 months (around 11/02/2019) for diabetic nail trim.

## 2019-08-08 MED FILL — ONE TOUCH VERIO TEST STRIP: 50 days supply | Qty: 50 | Fill #1

## 2019-08-19 MED FILL — JANUVIA 100 MG TABLET: 100 | 30 days supply | Qty: 30 | Fill #0

## 2019-08-19 MED FILL — RAMIPRIL 10 MG CAPSULE: 10 | 90 days supply | Qty: 90 | Fill #1

## 2019-08-19 MED FILL — SM CLEARLAX POWDER: 17 | 30 days supply | Qty: 510 | Fill #5

## 2019-08-19 MED FILL — AMLODIPINE BESYLATE 5 MG TA: 5 | 90 days supply | Qty: 90 | Fill #2

## 2019-08-30 MED FILL — STOOL SOFTENER/LAXATIVE 50-: 50-8.6 | 50 days supply | Qty: 100 | Fill #3

## 2019-08-30 MED FILL — SIMVASTATIN 40 MG TABS: 40 | 31 days supply | Qty: 31 | Fill #1

## 2019-09-30 MED FILL — SM CLEARLAX POWDER: 17 | 30 days supply | Qty: 510 | Fill #6

## 2019-10-03 MED FILL — SIMVASTATIN 40 MG TABS: 40 | 31 days supply | Qty: 31 | Fill #2

## 2019-10-30 MED FILL — SM CLEARLAX POWDER: 17 | 30 days supply | Qty: 510 | Fill #7

## 2019-10-30 MED FILL — SIMVASTATIN 40 MG TABS: 40 | 31 days supply | Qty: 31 | Fill #3

## 2019-11-08 ENCOUNTER — Ambulatory Visit: Payer: Medicare Other | Admitting: Podiatry

## 2019-11-20 MED FILL — AMLODIPINE BESYLATE 5 MG TA: 5 | 90 days supply | Qty: 90 | Fill #3

## 2019-11-20 MED FILL — JANUVIA 100 MG TABLET: 100 | 30 days supply | Qty: 30 | Fill #3

## 2019-11-20 MED FILL — RAMIPRIL 10 MG CAPSULE: 10 | 90 days supply | Qty: 90 | Fill #2

## 2019-11-21 MED FILL — ONE TOUCH VERIO TEST STRIP: 50 days supply | Qty: 50 | Fill #2

## 2019-12-11 MED FILL — SM CLEARLAX POWDER: 17 | 30 days supply | Qty: 510 | Fill #8

## 2019-12-11 MED FILL — STOOL SOFTENER/LAXATIVE 50-: 50-8.6 | 50 days supply | Qty: 100 | Fill #5

## 2019-12-11 MED FILL — SIMVASTATIN 40 MG TABS: 40 | 31 days supply | Qty: 31 | Fill #4

## 2019-12-27 MED FILL — METFORMIN HCL 500 MG TABS: 500 | 90 days supply | Qty: 270 | Fill #3

## 2019-12-27 MED FILL — JANUVIA 100 MG TABLET: 100 | 30 days supply | Qty: 30 | Fill #4

## 2020-01-10 ENCOUNTER — Other Ambulatory Visit (HOSPITAL_BASED_OUTPATIENT_CLINIC_OR_DEPARTMENT_OTHER): Payer: Self-pay | Admitting: Internal Medicine

## 2020-01-10 MED FILL — SIMVASTATIN 40 MG TABS: 40 | 31 days supply | Qty: 31 | Fill #0

## 2020-01-14 MED FILL — SM CLEARLAX POWDER: 17 | 30 days supply | Qty: 510 | Fill #9

## 2020-01-15 MED FILL — AZITHROMYCIN 250 MG TABLET: 250 | 5 days supply | Qty: 6 | Fill #0

## 2020-01-16 MED FILL — METHYLPREDNISOLONE 4 MG TBP: 4 | 6 days supply | Qty: 21 | Fill #0

## 2020-01-16 MED FILL — levoFLOXacin 500 MG TABS: 500 | 7 days supply | Qty: 7 | Fill #0

## 2020-01-20 MED FILL — ALBUTEROL SULFATE HFA 108 (: 108 (90 BAS | 17 days supply | Qty: 7 | Fill #0

## 2020-01-23 MED FILL — FLUoxetine HCL 40 MG CAPS: 40 | 90 days supply | Qty: 90 | Fill #2

## 2020-01-23 MED FILL — JANUVIA 100 MG TABLET: 100 | 30 days supply | Qty: 30 | Fill #5

## 2020-01-29 MED FILL — STOOL SOFTENER/LAXATIVE 50-: 50-8.6 | 50 days supply | Qty: 100 | Fill #6

## 2020-02-10 MED FILL — SIMVASTATIN 40 MG TABS: 40 | 31 days supply | Qty: 31 | Fill #1

## 2020-02-17 ENCOUNTER — Other Ambulatory Visit (HOSPITAL_BASED_OUTPATIENT_CLINIC_OR_DEPARTMENT_OTHER): Payer: Self-pay | Admitting: Internal Medicine

## 2020-02-17 MED FILL — SM CLEARLAX POWDER: 17 | 30 days supply | Qty: 510 | Fill #10

## 2020-02-17 MED FILL — RAMIPRIL 10 MG CAPSULE: 10 | 90 days supply | Qty: 90 | Fill #0

## 2020-02-18 ENCOUNTER — Other Ambulatory Visit (HOSPITAL_BASED_OUTPATIENT_CLINIC_OR_DEPARTMENT_OTHER): Payer: Self-pay | Admitting: Internal Medicine

## 2020-02-18 ENCOUNTER — Ambulatory Visit: Payer: Medicare Other | Attending: Internal Medicine

## 2020-02-18 DIAGNOSIS — Z23 Encounter for immunization: Secondary | ICD-10-CM

## 2020-02-24 ENCOUNTER — Other Ambulatory Visit (HOSPITAL_BASED_OUTPATIENT_CLINIC_OR_DEPARTMENT_OTHER): Payer: Self-pay | Admitting: Internal Medicine

## 2020-02-24 MED FILL — ONE TOUCH VERIO TEST STRIP: 50 days supply | Qty: 50 | Fill #3

## 2020-02-24 MED FILL — JANUVIA 100 MG TABLET: 100 | 30 days supply | Qty: 30 | Fill #0

## 2020-02-24 MED FILL — AMLODIPINE BESYLATE 5 MG TA: 5 | 90 days supply | Qty: 90 | Fill #0

## 2020-02-24 MED FILL — ONETOUCH DELICA PLUS LANCET: 90 days supply | Qty: 100 | Fill #1

## 2020-02-26 MED FILL — PFIZER-BIONTECH COVID-19 VA: 30 | 1 days supply | Qty: 0 | Fill #0

## 2020-03-19 MED FILL — SIMVASTATIN 40 MG TABS: 40 | 31 days supply | Qty: 31 | Fill #2

## 2020-03-23 ENCOUNTER — Other Ambulatory Visit (HOSPITAL_BASED_OUTPATIENT_CLINIC_OR_DEPARTMENT_OTHER): Payer: Self-pay | Admitting: Internal Medicine

## 2020-03-23 MED FILL — METFORMIN HCL 500 MG TABS: 500 | 90 days supply | Qty: 270 | Fill #0

## 2020-03-23 MED FILL — JANUVIA 100 MG TABLET: 100 | 30 days supply | Qty: 30 | Fill #1

## 2020-03-23 MED FILL — POLYETHYLENE GLYCOL 3350 PO: 17 | 30 days supply | Qty: 510 | Fill #0

## 2020-04-20 ENCOUNTER — Other Ambulatory Visit (HOSPITAL_BASED_OUTPATIENT_CLINIC_OR_DEPARTMENT_OTHER): Payer: Self-pay | Admitting: Internal Medicine

## 2020-04-20 MED FILL — ONE TOUCH VERIO TEST STRIP: 50 days supply | Qty: 50 | Fill #0

## 2020-04-24 ENCOUNTER — Other Ambulatory Visit (HOSPITAL_BASED_OUTPATIENT_CLINIC_OR_DEPARTMENT_OTHER): Payer: Self-pay | Admitting: Internal Medicine

## 2020-04-24 MED FILL — SIMVASTATIN 40 MG TABS: 40 | 31 days supply | Qty: 31 | Fill #3

## 2020-04-27 MED FILL — FLUoxetine HCL 40 MG CAPS: 40 | 90 days supply | Qty: 90 | Fill #3

## 2020-05-25 MED FILL — AMLODIPINE BESYLATE 5 MG TA: 5 | 90 days supply | Qty: 90 | Fill #1

## 2020-05-25 MED FILL — RAMIPRIL 10 MG CAPSULE: 10 | 90 days supply | Qty: 90 | Fill #1

## 2020-05-25 MED FILL — SIMVASTATIN 40 MG TABS: 40 | 31 days supply | Qty: 31 | Fill #4

## 2020-06-10 MED FILL — METFORMIN HCL 500 MG TABS: 500 | 90 days supply | Qty: 360 | Fill #0

## 2020-06-26 ENCOUNTER — Other Ambulatory Visit (HOSPITAL_BASED_OUTPATIENT_CLINIC_OR_DEPARTMENT_OTHER): Payer: Self-pay | Admitting: Internal Medicine

## 2020-06-26 MED FILL — SIMVASTATIN 40 MG TABS: 40 | 31 days supply | Qty: 31 | Fill #0

## 2020-06-30 MED FILL — ONE TOUCH VERIO TEST STRIP: 50 days supply | Qty: 50 | Fill #1

## 2020-07-28 ENCOUNTER — Other Ambulatory Visit (HOSPITAL_BASED_OUTPATIENT_CLINIC_OR_DEPARTMENT_OTHER): Payer: Self-pay | Admitting: Internal Medicine

## 2020-07-28 MED FILL — FLUoxetine HCL 40 MG CAPS: 40 | 90 days supply | Qty: 90 | Fill #0

## 2020-07-31 ENCOUNTER — Other Ambulatory Visit: Payer: Self-pay

## 2020-07-31 ENCOUNTER — Ambulatory Visit (INDEPENDENT_AMBULATORY_CARE_PROVIDER_SITE_OTHER): Payer: Medicare Other | Admitting: Podiatry

## 2020-07-31 DIAGNOSIS — E119 Type 2 diabetes mellitus without complications: Secondary | ICD-10-CM | POA: Diagnosis not present

## 2020-07-31 DIAGNOSIS — M79675 Pain in left toe(s): Secondary | ICD-10-CM

## 2020-07-31 DIAGNOSIS — M2041 Other hammer toe(s) (acquired), right foot: Secondary | ICD-10-CM | POA: Diagnosis not present

## 2020-07-31 DIAGNOSIS — Z794 Long term (current) use of insulin: Secondary | ICD-10-CM | POA: Diagnosis not present

## 2020-07-31 DIAGNOSIS — B351 Tinea unguium: Secondary | ICD-10-CM

## 2020-07-31 DIAGNOSIS — M79674 Pain in right toe(s): Secondary | ICD-10-CM

## 2020-07-31 NOTE — Patient Instructions (Signed)

## 2020-08-05 ENCOUNTER — Encounter: Payer: Self-pay | Admitting: Podiatry

## 2020-08-05 NOTE — Progress Notes (Signed)
Subjective:  Patient ID: [name], male    DOB: [birth year],  MRN: [id number]  Chief Complaint  Patient presents with  . Ingrown Toenail    Right great ingrown. Painful with redness and edema.     70 y.o. male presents with the above complaint.  Patient presents with thickened elongated dystrophic toenails x10.  Patient states is painful to touch.  He would like to have them debrided and is not able to do it himself.  He also has secondary complaint of right second digit hammertoe he would like to discuss treatment options for it.  He does not want to do any surgery just wants to discuss if there is, offloading devices that are available.  He is a diabetic with unknown A1c   Review of Systems: Negative except as noted in the HPI. Denies N/V/F/Ch.  Past Medical History:  Diagnosis Date  . Arthritis    back, neck, fingers  . Cancer [facility])    prostate cancer -dx. bx.   . Chronic constipation   . Elevated PSA   . Frequency of urination   . Hearing loss    no aids used  . Hemorrhoids    occ. flare ups mostly springtime.  Marland Kitchen History of gastroesophageal reflux (GERD)   . History of ileus    POST OP 2008 SURGERY-- RESOLVED WITHOUT SURGICAL INTERVENTION  . History of jaundice as a child    TEEN-- RESOLVED  . Hyperlipidemia   . Hypertension   . Neuromuscular disorder ([place])    improved numbness/tingling-remains slight tingling/numbness left index finger  . Nocturia   . OSA on CPAP    cpap settings 10  . Shortness of breath on exertion   . Type 2 diabetes mellitus ([place])   . Wears glasses     Current Outpatient Medications:  .  albuterol (VENTOLIN HFA) 108 (90 Base) MCG/ACT inhaler, Inhale into the lungs., Disp: , Rfl:  .  amLODipine (NORVASC) 5 MG tablet, Take 5 mg by mouth daily., Disp: , Rfl:  .  Aspirin Buf,CaCarb-MgCarb-MgO, 81 MG TABS, Take by mouth., Disp: , Rfl:  .  ciprofloxacin (CIPRO) 500 MG tablet, Take 1 tablet (500 mg total) by mouth 2 (two) times daily. Start day  prior to office visit for foley removal, Disp: 6 tablet, Rfl: 0 .  docusate sodium (COLACE) 100 MG capsule, Take 300 mg by mouth every morning., Disp: , Rfl:  .  FLUoxetine (PROZAC) 20 MG tablet, Take 20 mg by mouth every evening. , Disp: , Rfl:  .  FLUoxetine (PROZAC) 40 MG capsule, , Disp: , Rfl:  .  HYDROcodone-acetaminophen (NORCO) 5-325 MG per tablet, Take 1-2 tablets by mouth every 6 (six) hours as needed., Disp: 30 tablet, Rfl: 0 .  Lancets (ONETOUCH DELICA PLUS [id number]) MISC, Apply topically daily., Disp: , Rfl:  .  loratadine (CLARITIN) 10 MG tablet, 1 tablet, Disp: , Rfl:  .  metFORMIN (GLUCOPHAGE) 500 MG tablet, Take 500-1,000 mg by mouth 2 (two) times daily with a meal. One tab in AM and Two tabs in PM, Disp: , Rfl:  .  Omega-3 Fatty Acids (FISH OIL) 1000 MG CAPS, 2 capsules, Disp: , Rfl:  .  ONETOUCH VERIO test strip, daily., Disp: , Rfl:  .  polyethylene glycol powder (SM CLEARLAX) 17 GM/SCOOP powder, MIX 17 GM IN 8 OZ OF LIQUID ONCE A DAY AND DRINK, Disp: , Rfl:  .  ramipril (ALTACE) 10 MG tablet, Take 10 mg by mouth every morning. , Disp: ,  Rfl:  .  senna (SENOKOT) 8.6 MG tablet, Take 4 tablets by mouth at bedtime., Disp: , Rfl:  .  simvastatin (ZOCOR) 40 MG tablet, Take 40 mg by mouth every evening., Disp: , Rfl:  .  sitaGLIPtin (JANUVIA) 100 MG tablet, Take 100 mg by mouth every morning., Disp: , Rfl:   Social History   Tobacco Use  Smoking Status Never Smoker  Smokeless Tobacco Never Used    No Known Allergies Objective:   There were no vitals filed for this visit. There is no height or weight on file to calculate BMI. Constitutional Well developed. Well nourished.  Vascular Dorsalis pedis pulses palpable bilaterally. Posterior tibial pulses palpable bilaterally. Capillary refill normal to all digits.  No cyanosis or clubbing noted. Pedal hair growth normal.  Neurologic Normal speech. Oriented to person, place, and time. Epicritic sensation to light touch  grossly present bilaterally.  Dermatologic Painful ingrowing nail at lateral nail borders of the hallux nail right.  Nail Exam: Pt has thick disfigured discolored nails with subungual debris noted bilateral entire nail hallux through fifth toenails No other open wounds. No skin lesions.  Orthopedic: Normal joint ROM without pain or crepitus bilaterally. No visible deformities. No bony tenderness.   Radiographs: None Assessment:   1. Hammertoe of second toe of right foot   2. Pain due to onychomycosis of toenails of both feet   3. Type 2 diabetes mellitus without complication, with long-term current use of insulin ([place])    Plan:  Patient was evaluated and treated and all questions answered.  Right second hammertoe contracture -I explained the patient the etiology of hammertoe and various treatment options were discussed.  At this time patient will benefit from conservative treatment option as it is only very mildly painful.  Offloading pads and protectors were dispensed.  If it continues to hurt and we can discuss surgical options at that time.  Patient states understanding  Ingrown Nail, right -Clinically healed.  Onychomycosis with pain  -Nails palliatively debrided as below. -Educated on self-care  Procedure: Nail Debridement Rationale: pain  Type of Debridement: manual, sharp debridement. Instrumentation: Nail nipper, rotary burr. Number of Nails: 10  Procedures and Treatment: Consent by patient was obtained for treatment procedures. The patient understood the discussion of treatment and procedures well. All questions were answered thoroughly reviewed. Debridement of mycotic and hypertrophic toenails, 1 through 5 bilateral and clearing of subungual debris. No ulceration, no infection noted.  Return Visit-Office Procedure: Patient instructed to return to the office for a follow up visit 3 months for continued evaluation and treatment.  Boneta Lucks, DPM   No follow-ups on  file.

## 2020-08-14 ENCOUNTER — Other Ambulatory Visit (HOSPITAL_BASED_OUTPATIENT_CLINIC_OR_DEPARTMENT_OTHER): Payer: Self-pay

## 2020-08-14 ENCOUNTER — Other Ambulatory Visit: Payer: Self-pay | Admitting: Internal Medicine

## 2020-08-16 MED FILL — Ramipril Cap 10 MG: ORAL | 90 days supply | Qty: 90 | Fill #0 | Status: AC

## 2020-08-17 ENCOUNTER — Other Ambulatory Visit (HOSPITAL_BASED_OUTPATIENT_CLINIC_OR_DEPARTMENT_OTHER): Payer: Self-pay

## 2020-08-18 ENCOUNTER — Ambulatory Visit: Payer: Medicare Other | Attending: Internal Medicine

## 2020-08-18 DIAGNOSIS — Z23 Encounter for immunization: Secondary | ICD-10-CM

## 2020-08-18 NOTE — Progress Notes (Signed)
   Covid-19 Vaccination Clinic  Name:  [name]    MRN: [id number] DOB: October 15, 1950  08/18/2020  [name] was observed post Covid-19 immunization for 15 minutes without incident. He was provided with Vaccine Information Sheet and instruction to access the V-Safe system.   [name] was instructed to call 911 with any severe reactions post vaccine: Marland Kitchen Difficulty breathing  . Swelling of face and throat  . A fast heartbeat  . A bad rash all over body  . Dizziness and weakness   Immunizations Administered    Name Date Dose VIS Date Route   PFIZER Comrnaty(Gray TOP) Covid-19 Vaccine 08/18/2020  2:14 PM 0.3 mL 04/16/2020 Intramuscular   Manufacturer: [place]   Lot: [id number]   NDC: [phone]

## 2020-08-20 ENCOUNTER — Other Ambulatory Visit (HOSPITAL_BASED_OUTPATIENT_CLINIC_OR_DEPARTMENT_OTHER): Payer: Self-pay

## 2020-08-20 ENCOUNTER — Other Ambulatory Visit: Payer: Self-pay | Admitting: Internal Medicine

## 2020-08-20 MED ORDER — SENNOSIDES-DOCUSATE SODIUM 8.6-50 MG PO TABS: 2.0000 | ORAL_TABLET | Freq: Every day | ORAL | 10 refills | Status: DC

## 2020-08-20 MED FILL — Polyethylene Glycol 3350 Oral Powder 17 GM/SCOOP: ORAL | 30 days supply | Qty: 510 | Fill #0 | Status: AC

## 2020-08-24 ENCOUNTER — Other Ambulatory Visit (HOSPITAL_BASED_OUTPATIENT_CLINIC_OR_DEPARTMENT_OTHER): Payer: Self-pay

## 2020-08-24 MED ORDER — PFIZER-BIONT COVID-19 VAC-TRIS 30 MCG/0.3ML IM SUSP
INTRAMUSCULAR | 0 refills | Status: DC
  Filled 2020-08-24: qty 0.3, 1d supply, fill #0

## 2020-08-30 MED FILL — Simvastatin Tab 40 MG: ORAL | 31 days supply | Qty: 31 | Fill #0 | Status: AC

## 2020-08-30 MED FILL — Amlodipine Besylate Tab 5 MG (Base Equivalent): ORAL | 90 days supply | Qty: 90 | Fill #0 | Status: AC

## 2020-08-31 ENCOUNTER — Other Ambulatory Visit (HOSPITAL_BASED_OUTPATIENT_CLINIC_OR_DEPARTMENT_OTHER): Payer: Self-pay

## 2020-09-03 ENCOUNTER — Other Ambulatory Visit: Payer: Self-pay | Admitting: Internal Medicine

## 2020-09-03 ENCOUNTER — Other Ambulatory Visit (HOSPITAL_BASED_OUTPATIENT_CLINIC_OR_DEPARTMENT_OTHER): Payer: Self-pay

## 2020-09-04 ENCOUNTER — Other Ambulatory Visit (HOSPITAL_BASED_OUTPATIENT_CLINIC_OR_DEPARTMENT_OTHER): Payer: Self-pay

## 2020-09-04 MED ORDER — FLUTICASONE PROPIONATE 50 MCG/ACT NA SUSP
NASAL | 0 refills | Status: DC
  Filled 2020-09-04: qty 16, 30d supply, fill #0

## 2020-09-06 ENCOUNTER — Other Ambulatory Visit: Payer: Self-pay | Admitting: Internal Medicine

## 2020-09-07 ENCOUNTER — Other Ambulatory Visit (HOSPITAL_BASED_OUTPATIENT_CLINIC_OR_DEPARTMENT_OTHER): Payer: Self-pay

## 2020-09-08 ENCOUNTER — Other Ambulatory Visit: Payer: Self-pay | Admitting: Internal Medicine

## 2020-09-08 ENCOUNTER — Other Ambulatory Visit (HOSPITAL_BASED_OUTPATIENT_CLINIC_OR_DEPARTMENT_OTHER): Payer: Self-pay

## 2020-09-08 MED FILL — Metformin HCl Tab 500 MG: ORAL | 90 days supply | Qty: 270 | Fill #0 | Status: AC

## 2020-09-09 ENCOUNTER — Other Ambulatory Visit (HOSPITAL_BASED_OUTPATIENT_CLINIC_OR_DEPARTMENT_OTHER): Payer: Self-pay

## 2020-09-10 ENCOUNTER — Other Ambulatory Visit (HOSPITAL_BASED_OUTPATIENT_CLINIC_OR_DEPARTMENT_OTHER): Payer: Self-pay

## 2020-09-10 ENCOUNTER — Other Ambulatory Visit: Payer: Self-pay | Admitting: Internal Medicine

## 2020-09-11 ENCOUNTER — Other Ambulatory Visit (HOSPITAL_BASED_OUTPATIENT_CLINIC_OR_DEPARTMENT_OTHER): Payer: Self-pay

## 2020-09-15 ENCOUNTER — Other Ambulatory Visit (HOSPITAL_BASED_OUTPATIENT_CLINIC_OR_DEPARTMENT_OTHER): Payer: Self-pay

## 2020-09-15 ENCOUNTER — Other Ambulatory Visit: Payer: Self-pay | Admitting: Internal Medicine

## 2020-09-15 MED FILL — Glucose Blood Test Strip: 50 days supply | Qty: 50 | Fill #0 | Status: AC

## 2020-09-17 ENCOUNTER — Other Ambulatory Visit (HOSPITAL_BASED_OUTPATIENT_CLINIC_OR_DEPARTMENT_OTHER): Payer: Self-pay

## 2020-09-18 ENCOUNTER — Other Ambulatory Visit: Payer: Self-pay | Admitting: Internal Medicine

## 2020-09-18 ENCOUNTER — Other Ambulatory Visit (HOSPITAL_BASED_OUTPATIENT_CLINIC_OR_DEPARTMENT_OTHER): Payer: Self-pay

## 2020-09-21 ENCOUNTER — Other Ambulatory Visit (HOSPITAL_BASED_OUTPATIENT_CLINIC_OR_DEPARTMENT_OTHER): Payer: Self-pay

## 2020-09-21 MED ORDER — FLUOXETINE HCL 40 MG PO CAPS
ORAL_CAPSULE | ORAL | 1 refills | Status: AC
  Filled 2020-09-21: qty 90, 90d supply, fill #0

## 2020-10-09 ENCOUNTER — Other Ambulatory Visit (HOSPITAL_BASED_OUTPATIENT_CLINIC_OR_DEPARTMENT_OTHER): Payer: Self-pay

## 2020-10-09 MED FILL — Simvastatin Tab 40 MG: ORAL | 31 days supply | Qty: 31 | Fill #1 | Status: AC

## 2020-10-09 MED FILL — Polyethylene Glycol 3350 Oral Powder 17 GM/SCOOP: ORAL | 30 days supply | Qty: 510 | Fill #1 | Status: AC

## 2020-10-14 ENCOUNTER — Other Ambulatory Visit (HOSPITAL_BASED_OUTPATIENT_CLINIC_OR_DEPARTMENT_OTHER): Payer: Self-pay

## 2020-10-20 ENCOUNTER — Encounter: Payer: Self-pay | Admitting: Orthopedic Surgery

## 2020-10-20 DIAGNOSIS — Z860100 Personal history of colon polyps, unspecified: Secondary | ICD-10-CM | POA: Insufficient documentation

## 2020-10-20 DIAGNOSIS — N400 Enlarged prostate without lower urinary tract symptoms: Secondary | ICD-10-CM | POA: Insufficient documentation

## 2020-10-20 DIAGNOSIS — E782 Mixed hyperlipidemia: Secondary | ICD-10-CM | POA: Insufficient documentation

## 2020-10-20 DIAGNOSIS — K5900 Constipation, unspecified: Secondary | ICD-10-CM | POA: Diagnosis present

## 2020-10-20 DIAGNOSIS — E1142 Type 2 diabetes mellitus with diabetic polyneuropathy: Secondary | ICD-10-CM | POA: Diagnosis present

## 2020-10-20 DIAGNOSIS — R972 Elevated prostate specific antigen [PSA]: Secondary | ICD-10-CM | POA: Diagnosis present

## 2020-10-20 DIAGNOSIS — F325 Major depressive disorder, single episode, in full remission: Secondary | ICD-10-CM | POA: Diagnosis present

## 2020-10-20 DIAGNOSIS — J309 Allergic rhinitis, unspecified: Secondary | ICD-10-CM | POA: Diagnosis present

## 2020-10-20 DIAGNOSIS — G4730 Sleep apnea, unspecified: Secondary | ICD-10-CM | POA: Diagnosis present

## 2020-10-20 DIAGNOSIS — F419 Anxiety disorder, unspecified: Secondary | ICD-10-CM | POA: Diagnosis present

## 2020-10-20 DIAGNOSIS — E7800 Pure hypercholesterolemia, unspecified: Secondary | ICD-10-CM | POA: Insufficient documentation

## 2020-10-20 DIAGNOSIS — E119 Type 2 diabetes mellitus without complications: Secondary | ICD-10-CM

## 2020-10-20 DIAGNOSIS — K219 Gastro-esophageal reflux disease without esophagitis: Secondary | ICD-10-CM | POA: Diagnosis present

## 2020-10-20 DIAGNOSIS — E669 Obesity, unspecified: Secondary | ICD-10-CM | POA: Diagnosis present

## 2020-10-20 DIAGNOSIS — I10 Essential (primary) hypertension: Secondary | ICD-10-CM | POA: Diagnosis present

## 2020-10-20 DIAGNOSIS — Z8546 Personal history of malignant neoplasm of prostate: Secondary | ICD-10-CM

## 2020-10-20 DIAGNOSIS — G4733 Obstructive sleep apnea (adult) (pediatric): Secondary | ICD-10-CM | POA: Diagnosis present

## 2020-10-20 DIAGNOSIS — M1711 Unilateral primary osteoarthritis, right knee: Secondary | ICD-10-CM

## 2020-10-20 DIAGNOSIS — D72829 Elevated white blood cell count, unspecified: Secondary | ICD-10-CM | POA: Insufficient documentation

## 2020-10-20 HISTORY — DX: Unilateral primary osteoarthritis, right knee: M17.11

## 2020-10-20 NOTE — H&P (Addendum)
TOTAL KNEE ADMISSION H&P  Patient is being admitted for right total knee arthroplasty.  Subjective:  Chief Complaint:right knee pain.  HPI: [name], 70 y.o. male, has a history of pain and functional disability in the right knee due to arthritis and has failed non-surgical conservative treatments for greater than 12 weeks to includeNSAID's and/or analgesics, corticosteriod injections, viscosupplementation injections, flexibility and strengthening excercises, supervised PT with diminished ADL's post treatment, weight reduction as appropriate, and activity modification.  Onset of symptoms was gradual, starting 10 years ago with gradually worsening course since that time. The patient noted no past surgery on the right knee(s).  Patient currently rates pain in the right knee(s) at 10 out of 10 with activity. Patient has night pain, worsening of pain with activity and weight bearing, pain that interferes with activities of daily living, crepitus, and joint swelling.  Patient has evidence of subchondral cysts, periarticular osteophytes, and joint space narrowing by imaging studies. There is no active infection.  Patient Active Problem List   Diagnosis Date Noted   Allergic rhinitis 10/20/2020   Anxiety 10/20/2020   Benign prostatic hyperplasia 10/20/2020   Constipation 10/20/2020   Diabetes mellitus without complication ([place]) 66/10/3014   Diabetic peripheral neuropathy associated with type 2 diabetes mellitus ([place]) 10/20/2020   Elevated PSA 10/20/2020   Gastroesophageal reflux disease 10/20/2020   History of malignant neoplasm of prostate 10/20/2020   Hypertension 10/20/2020   Major depression in remission ([place]) 10/20/2020   Leukocytosis 10/20/2020   Mixed hyperlipidemia 10/20/2020   Obesity 10/20/2020   Obstructive sleep apnea syndrome 10/20/2020   Personal history of colonic polyps 10/20/2020   Pure hypercholesterolemia 10/20/2020   Primary localized osteoarthritis of right knee  10/20/2020   Prostate cancer ([place]) 08/11/2014   Past Medical History:  Diagnosis Date   Arthritis    back, neck, fingers   Cancer ([place])    prostate cancer -dx. bx.    Chronic constipation    Elevated PSA    Frequency of urination    Hearing loss    no aids used   Hemorrhoids    occ. flare ups mostly springtime.   History of gastroesophageal reflux (GERD)    History of ileus    POST OP 2008 SURGERY-- RESOLVED WITHOUT SURGICAL INTERVENTION   History of jaundice as a child    TEEN-- RESOLVED   Hyperlipidemia    Hypertension    Ileus, postoperative ([place])    after total knee in 2008   Neuromuscular disorder ([place])    improved numbness/tingling-remains slight tingling/numbness left index finger   Nocturia    OSA on CPAP    cpap settings 10   Primary localized osteoarthritis of right knee 10/20/2020   Shortness of breath on exertion    Type 2 diabetes mellitus ([place])    Wears glasses     Past Surgical History:  Procedure Laterality Date   ANTERIOR CERVICAL DECOMP/DISCECTOMY FUSION  jan 2015   fusion   APPENDECTOMY  1963   COLONOSCOPY W/ POLYPECTOMY     JOINT REPLACEMENT     LTKA '08   LYMPHADENECTOMY Bilateral 08/11/2014   Procedure: PELVIC LYMPHADENECTOMY;  Surgeon: Raynelle Bring, MD;  Location: [facility];  Service: Urology;  Laterality: Bilateral;   PROSTATE BIOPSY N/A 07/01/2014   Procedure: BIOPSY TRANSRECTAL ULTRASONIC PROSTATE (TUBP);  Surgeon: Arvil Persons, MD;  Location: [facility];  Service: Urology;  Laterality: N/A;   ROBOT ASSISTED LAPAROSCOPIC RADICAL PROSTATECTOMY N/A 08/11/2014   Procedure: ROBOTIC ASSISTED LAPAROSCOPIC  RADICAL PROSTATECTOMY LEVEL 2 UMBILICAL HERNIA REPAIR;  Surgeon: Raynelle Bring, MD;  Location: [facility];  Service: Urology;  Laterality: N/A;   TOTAL KNEE ARTHROPLASTY Left 2008   TRANSRECTAL ULTRASOUND GUIDED PROSTATE BX  2012   VASECTOMY  2001  w/ gen. anes.    No current facility-administered medications for this encounter.   Current  Outpatient Medications  Medication Sig Dispense Refill Last Dose   amLODipine (NORVASC) 5 MG tablet Take 1 tablet by mouth daily.      fluticasone (FLONASE) 50 MCG/ACT nasal spray Place into the nose.      metFORMIN (GLUCOPHAGE) 500 MG tablet Take 1 tablet by mouth 3 (three) times daily.      simvastatin (ZOCOR) 40 MG tablet Take 1 tablet by mouth daily.      albuterol (VENTOLIN HFA) 108 (90 Base) MCG/ACT inhaler Inhale into the lungs.      amLODipine (NORVASC) 5 MG tablet Take 5 mg by mouth daily.      amLODipine (NORVASC) 5 MG tablet TAKE 1 TABLET BY MOUTH ONCE DAILY 90 tablet 3    Aspirin Buf,CaCarb-MgCarb-MgO, 81 MG TABS Take by mouth.      ciprofloxacin (CIPRO) 500 MG tablet Take 1 tablet (500 mg total) by mouth 2 (two) times daily. Start day prior to office visit for foley removal 6 tablet 0    COVID-19 mRNA Vac-TriS, Pfizer, (PFIZER-BIONT COVID-19 VAC-TRIS) SUSP injection Inject into the muscle. 0.3 mL 0    COVID-19 mRNA vaccine, Pfizer, 30 MCG/0.3ML injection INJECT AS DIRECTED .3 mL 0    docusate sodium (COLACE) 100 MG capsule Take 300 mg by mouth every morning.      docusate sodium (COLACE) 50 MG capsule 2 capsules      FLUoxetine (PROZAC) 20 MG tablet Take 20 mg by mouth every evening.       FLUoxetine (PROZAC) 40 MG capsule       FLUoxetine (PROZAC) 40 MG capsule Take 1 capsule by mouth once daily 90 capsule 1    FLUoxetine (PROZAC) 40 MG capsule TAKE 1 CAPSULE      fluticasone (FLONASE) 50 MCG/ACT nasal spray Place 1 spray into each nostril daily for 30 days 16 g 0    glucose blood test strip USE AS DIRECTED TO CHECK BLOOD SUGAR ONCE DAILY 50 strip 5    HYDROcodone-acetaminophen (NORCO) 5-325 MG per tablet Take 1-2 tablets by mouth every 6 (six) hours as needed. 30 tablet 0    Lancets (ONETOUCH DELICA PLUS [id number]) MISC Apply topically daily.      loratadine (CLARITIN) 10 MG tablet 1 tablet      loratadine (CLARITIN) 10 MG tablet 1 tablet      metFORMIN (GLUCOPHAGE) 500 MG  tablet Take 500-1,000 mg by mouth 2 (two) times daily with a meal. One tab in AM and Two tabs in PM      metFORMIN (GLUCOPHAGE) 500 MG tablet TAKE 2 TABLETS BY MOUTH TWICE A DAY WITH FOOD 360 tablet 0    metFORMIN (GLUCOPHAGE) 500 MG tablet TAKE 1 TABLET BY MOUTH THREE TIMES DAILY 270 tablet 3    Multiple Vitamin (MULTIVITAMIN) capsule Take 1 capsule by mouth daily.      Omega-3 1000 MG CAPS Take by mouth.      Omega-3 Fatty Acids (FISH OIL) 1000 MG CAPS 2 capsules      ONETOUCH VERIO test strip daily.      polyethylene glycol powder (GLYCOLAX/MIRALAX) 17 GM/SCOOP powder MIX 17 GRAMS INTO 8 OZ OF  LIQUID ONCE A DAY AND DRINK 510 g 11    polyethylene glycol powder (SM CLEARLAX) 17 GM/SCOOP powder MIX 17 GM IN 8 OZ OF LIQUID ONCE A DAY AND DRINK      ramipril (ALTACE) 10 MG capsule TAKE 1 CAPSULE BY MOUTH ONCE DAILY 90 capsule 3    ramipril (ALTACE) 10 MG capsule Take 1 capsule by mouth daily.      ramipril (ALTACE) 10 MG tablet Take 10 mg by mouth every morning.       senna (SENOKOT) 8.6 MG tablet Take 4 tablets by mouth at bedtime.      senna-docusate (STIMULANT LAXATIVE) 8.6-50 MG tablet Take 2 tablets by mouth at bedtime. 100 tablet 10    simvastatin (ZOCOR) 40 MG tablet Take 40 mg by mouth every evening.      simvastatin (ZOCOR) 40 MG tablet TAKE 1 TABLET BY MOUTH ONCE DAILY 31 tablet 4    simvastatin (ZOCOR) 40 MG tablet TAKE 1 TABLET BY MOUTH ONCE DAILY 31 tablet 4    sitaGLIPtin (JANUVIA) 100 MG tablet Take 100 mg by mouth every morning.      sitaGLIPtin (JANUVIA) 100 MG tablet TAKE 1 TABLET BY MOUTH ONCE DAILY 30 tablet 5    Allergies  Allergen Reactions   Morphine And Related     Confusion and hallucinations    Social History   Tobacco Use   Smoking status: Never   Smokeless tobacco: Never  Substance Use Topics   Alcohol use: No    No family history on file.   Review of Systems  Constitutional: Negative.   HENT: Negative.    Eyes: Negative.   Respiratory: Negative.     Cardiovascular: Negative.   Gastrointestinal: Negative.   Endocrine: Negative.   Genitourinary: Negative.   Musculoskeletal:  Positive for arthralgias, gait problem, joint swelling and myalgias.  Allergic/Immunologic: Negative.   Hematological: Negative.   Psychiatric/Behavioral: Negative.     Objective:  Physical Exam Constitutional:      Appearance: Normal appearance.  HENT:     Head: Normocephalic.     Right Ear: External ear normal.     Left Ear: External ear normal.     Nose: Nose normal.     Mouth/Throat:     Mouth: Mucous membranes are moist.  Eyes:     Conjunctiva/sclera: Conjunctivae normal.  Cardiovascular:     Rate and Rhythm: Normal rate and regular rhythm.  Pulmonary:     Effort: Pulmonary effort is normal.     Breath sounds: Normal breath sounds.  Abdominal:     General: Bowel sounds are normal.     Palpations: Abdomen is soft.  Genitourinary:    Comments: Not pertinent to current symptomatology therefore not examined.  Musculoskeletal:     Cervical back: Neck supple.     Comments: He is independently ambulatory with a varus gait.  He has 2+ crepitus.  Range of motion -5 to 110 degrees.  Medial and lateral joint line tenderness.  Distal neurovascular exam is intact.   Skin:    General: Skin is warm and dry.     Capillary Refill: Capillary refill takes less than 2 seconds.  Neurological:     General: No focal deficit present.     Mental Status: He is alert.  Psychiatric:        Mood and Affect: Mood normal.    Vital signs in last 24 hours: Temp:  [97.6 F (36.4 C)] 97.6 F (36.4 C) (06/14 1200) Pulse Rate:  [  87] 87 (06/14 1200) BP: (168)/(84) 168/84 (06/14 1200) SpO2:  [99 %] 99 % (06/14 1200) Weight:  [98.4 kg] 98.4 kg (06/14 1200)  Labs:   Estimated body mass index is 29.43 kg/m as calculated from the following:   Height as of this encounter: 6' (1.829 m).   Weight as of this encounter: 98.4 kg.   Imaging Review Plain radiographs  demonstrate severe degenerative joint disease of the right knee(s). The overall alignment issignificant varus. The bone quality appears to be good for age and reported activity level.      Assessment/Plan:  End stage arthritis, right knee  Principal Problem:   Primary localized osteoarthritis of right knee Active Problems:   Prostate cancer ([place])   Allergic rhinitis   Anxiety   Constipation   Diabetes mellitus without complication (HCC)   Diabetic peripheral neuropathy associated with type 2 diabetes mellitus (HCC)   Elevated PSA   Gastroesophageal reflux disease   History of malignant neoplasm of prostate   Hypertension   Major depression in remission ([place])   Obesity   Obstructive sleep apnea syndrome   The patient history, physical examination, clinical judgment of the provider and imaging studies are consistent with end stage degenerative joint disease of the right knee(s) and total knee arthroplasty is deemed medically necessary. The treatment options including medical management, injection therapy arthroscopy and arthroplasty were discussed at length. The risks and benefits of total knee arthroplasty were presented and reviewed. The risks due to aseptic loosening, infection, stiffness, patella tracking problems, thromboembolic complications and other imponderables were discussed. The patient acknowledged the explanation, agreed to proceed with the plan and consent was signed. Patient is being admitted for inpatient treatment for surgery, pain control, PT, OT, prophylactic antibiotics, VTE prophylaxis, progressive ambulation and ADL's and discharge planning. The patient is planning to be discharged  Discharge to home same day of surgery.  Will start outpatient physical therapy at Dr Archie Endo office on June 29th at 10 am   follow up with Dr Noemi Chapel for his post op July 7th at 11 am

## 2020-10-27 NOTE — Patient Instructions (Addendum)
DUE TO COVID-19 ONLY ONE VISITOR IS ALLOWED TO COME WITH YOU AND STAY IN THE WAITING ROOM ONLY DURING PRE OP AND PROCEDURE.   **NO VISITORS ARE ALLOWED IN THE SHORT STAY AREA OR RECOVERY ROOM!!**  IF YOU WILL BE ADMITTED INTO THE HOSPITAL YOU ARE ALLOWED ONLY TWO SUPPORT PEOPLE DURING VISITATION HOURS ONLY (10AM -8PM)   The support person(s) may change daily. The support person(s) must pass our screening, gel in and out, and wear a mask at all times, including in the patient's room. Patients must also wear a mask when staff or their support person are in the room.  No visitors under the age of 33. Any visitor under the age of 1 must be accompanied by an adult.    COVID SWAB TESTING MUST BE COMPLETED ON:  Thursday, 10-29-20 @ 2:30 PM   [address] [place], [place] [address]   You are not required to quarantine, however you are required to wear a well-fitted mask when you are out and around people not in your household.  Hand Hygiene often Do NOT share personal items Notify your provider if you are in close contact with someone who has COVID or you develop fever 100.4 or greater, new onset of sneezing, cough, sore throat, shortness of breath or body aches.        Your procedure is scheduled on:  Monday, 11-02-20   Report to [facility] Main  Entrance   Report to admitting at 7:30 AM   Call this number if you have problems the morning of surgery [phone]   Do not eat food :After Midnight.   May have liquids until 7:00 AM day of surgery  CLEAR LIQUID DIET  Foods Allowed                                                                     Foods Excluded  Water, Black Coffee and tea, regular and decaf               liquids that you cannot  Plain Jell-O in any flavor  (No red)                                     see through such as: Fruit ices (not with fruit pulp)                                      milk, soups, orange juice              Iced Popsicles (No red)                                       All solid food                                   Apple juices Sports drinks like Gatorade (No red) Lightly seasoned clear broth or consume(fat free) Sugar, honey syrup  Complete one G2 drink the morning of surgery at  7:00 AM the day of surgery.       The day of surgery:  Drink ONE (1) Pre-Surgery Clear Ensure or G2 by am the morning of surgery. Drink in one sitting. Do not sip.  This drink was given to you during your hospital  pre-op appointment visit. Nothing else to drink after completing the  Pre-Surgery Clear Ensure or G2.          If you have questions, please contact your surgeon's office.     Oral Hygiene is also important to reduce your risk of infection.                                    Remember - BRUSH YOUR TEETH THE MORNING OF SURGERY WITH YOUR REGULAR TOOTHPASTE   Do NOT smoke after Midnight   Take these medicines the morning of surgery with A SIP OF WATER: Loratadine  How to Manage Your Diabetes Before and After Surgery  Why is it important to control my blood sugar before and after surgery? Improving blood sugar levels before and after surgery helps healing and can limit problems. A way of improving blood sugar control is eating a healthy diet by:  Eating less sugar and carbohydrates  Increasing activity/exercise  Talking with your doctor about reaching your blood sugar goals High blood sugars (greater than 180 mg/dL) can raise your risk of infections and slow your recovery, so you will need to focus on controlling your diabetes during the weeks before surgery. Make sure that the doctor who takes care of your diabetes knows about your planned surgery including the date and location.  How do I manage my blood sugar before surgery? Check your blood sugar at least 4 times a day, starting 2 days before surgery, to make sure that the level is not too high or low. Check your blood sugar the morning of your surgery when you wake up  and every 2 hours until you get to the Short Stay unit. If your blood sugar is less than 70 mg/dL, you will need to treat for low blood sugar: Do not take insulin. Treat a low blood sugar (less than 70 mg/dL) with  cup of clear juice (cranberry or apple), 4 glucose tablets, OR glucose gel. Recheck blood sugar in 15 minutes after treatment (to make sure it is greater than 70 mg/dL). If your blood sugar is not greater than 70 mg/dL on recheck, call [phone] for further instructions. Report your blood sugar to the short stay nurse when you get to Short Stay.  If you are admitted to the hospital after surgery: Your blood sugar will be checked by the staff and you will probably be given insulin after surgery (instead of oral diabetes medicines) to make sure you have good blood sugar levels. The goal for blood sugar control after surgery is 80-180 mg/dL.   WHAT DO I DO ABOUT MY DIABETES MEDICATION?  Do not take oral diabetes medicines (pills) the morning of surgery.  THE NIGHT BEFORE SURGERY: Take Metformin as prescribed.       THE MORNING OF SURGERY:  Do not take Metformin.    Reviewed and Endorsed by [facility] Patient Education Committee, August 2015   DO NOT TAKE ANY ORAL DIABETIC MEDICATIONS DAY OF YOUR SURGERY  You may not have any metal on your body including hair pins, jewelry, and body piercing             Do not wear make-up, lotions, powders, perfumes/cologne, or deodorant              Men may shave face and neck.   Do not bring valuables to the hospital. [place].   Contacts, dentures or bridgework may not be worn into surgery.   Bring small overnight bag day of surgery.      Special Instructions: Bring a copy of your healthcare power of attorney and living will documents         the day of surgery if you haven't scanned them in before.              Please read over the following fact sheets you  were given: IF YOU HAVE QUESTIONS ABOUT YOUR PRE OP INSTRUCTIONS PLEASE CALL [phone]   [place] - Preparing for Surgery Before surgery, you can play an important role.  Because skin is not sterile, your skin needs to be as free of germs as possible.  You can reduce the number of germs on your skin by washing with CHG (chlorahexidine gluconate) soap before surgery.  CHG is an antiseptic cleaner which kills germs and bonds with the skin to continue killing germs even after washing. Please DO NOT use if you have an allergy to CHG or antibacterial soaps.  If your skin becomes reddened/irritated stop using the CHG and inform your nurse when you arrive at Short Stay. Do not shave (including legs and underarms) for at least 48 hours prior to the first CHG shower.  You may shave your face/neck.  Please follow these instructions carefully:  1.  Shower with CHG Soap the night before surgery and the  morning of surgery.  2.  If you choose to wash your hair, wash your hair first as usual with your normal  shampoo.  3.  After you shampoo, rinse your hair and body thoroughly to remove the shampoo.                             4.  Use CHG as you would any other liquid soap.  You can apply chg directly to the skin and wash.  Gently with a scrungie or clean washcloth.  5.  Apply the CHG Soap to your body ONLY FROM THE NECK DOWN.   Do   not use on face/ open                           Wound or open sores. Avoid contact with eyes, ears mouth and   genitals (private parts).                       Wash face,  Genitals (private parts) with your normal soap.             6.  Wash thoroughly, paying special attention to the area where your    surgery  will be performed.  7.  Thoroughly rinse your body with warm water from the neck down.  8.  DO NOT shower/wash with your normal soap after using and rinsing off the CHG Soap.                9.  Pat yourself dry with a clean towel.            10.  Wear clean pajamas.             11.  Place clean sheets on your bed the night of your first shower and do not  sleep with pets. Day of Surgery : Do not apply any lotions/deodorants the morning of surgery.  Please wear clean clothes to the hospital/surgery center.  FAILURE TO FOLLOW THESE INSTRUCTIONS MAY RESULT IN THE CANCELLATION OF YOUR SURGERY  PATIENT SIGNATURE_________________________________  NURSE SIGNATURE__________________________________  ________________________________________________________________________   [name]  An incentive spirometer is a tool that can help keep your lungs clear and active. This tool measures how well you are filling your lungs with each breath. Taking long deep breaths may help reverse or decrease the chance of developing breathing (pulmonary) problems (especially infection) following: A long period of time when you are unable to move or be active. BEFORE THE PROCEDURE  If the spirometer includes an indicator to show your best effort, your nurse or respiratory therapist will set it to a desired goal. If possible, sit up straight or lean slightly forward. Try not to slouch. Hold the incentive spirometer in an upright position. INSTRUCTIONS FOR USE  Sit on the edge of your bed if possible, or sit up as far as you can in bed or on a chair. Hold the incentive spirometer in an upright position. Breathe out normally. Place the mouthpiece in your mouth and seal your lips tightly around it. Breathe in slowly and as deeply as possible, raising the piston or the ball toward the top of the column. Hold your breath for 3-5 seconds or for as long as possible. Allow the piston or ball to fall to the bottom of the column. Remove the mouthpiece from your mouth and breathe out normally. Rest for a few seconds and repeat Steps 1 through 7 at least 10 times every 1-2 hours when you are awake. Take your time and take a few normal breaths between deep breaths. The spirometer may  include an indicator to show your best effort. Use the indicator as a goal to work toward during each repetition. After each set of 10 deep breaths, practice coughing to be sure your lungs are clear. If you have an incision (the cut made at the time of surgery), support your incision when coughing by placing a pillow or rolled up towels firmly against it. Once you are able to get out of bed, walk around indoors and cough well. You may stop using the incentive spirometer when instructed by your caregiver.  RISKS AND COMPLICATIONS Take your time so you do not get dizzy or light-headed. If you are in pain, you may need to take or ask for pain medication before doing incentive spirometry. It is harder to take a deep breath if you are having pain. AFTER USE Rest and breathe slowly and easily. It can be helpful to keep track of a log of your progress. Your caregiver can provide you with a simple table to help with this. If you are using the spirometer at home, follow these instructions: [place] IF:  You are having difficultly using the spirometer. You have trouble using the spirometer as often as instructed. Your pain medication is not giving enough relief while using the spirometer. You develop fever of 100.5 F (38.1 C) or higher. SEEK IMMEDIATE MEDICAL CARE IF:  You cough up bloody sputum that had not  been present before. You develop fever of 102 F (38.9 C) or greater. You develop worsening pain at or near the incision site. MAKE SURE YOU:  Understand these instructions. Will watch your condition. Will get help right away if you are not doing well or get worse. Document Released: 09/05/2006 Document Revised: 07/18/2011 Document Reviewed: 11/06/2006 [facility] Patient Information 2014 [place], [place].   ________________________________________________________________________

## 2020-10-28 ENCOUNTER — Encounter (HOSPITAL_COMMUNITY)
Admission: RE | Admit: 2020-10-28 | Discharge: 2020-10-28 | Disposition: A | Discharge status: Home / Self Care | Payer: Medicare Other | Source: Ambulatory Visit | Attending: Orthopedic Surgery | Admitting: Orthopedic Surgery

## 2020-10-28 ENCOUNTER — Other Ambulatory Visit: Payer: Self-pay

## 2020-10-28 ENCOUNTER — Encounter (HOSPITAL_COMMUNITY): Payer: Self-pay

## 2020-10-28 DIAGNOSIS — Z01818 Encounter for other preprocedural examination: Secondary | ICD-10-CM | POA: Diagnosis present

## 2020-10-28 HISTORY — DX: Anxiety disorder, unspecified: F41.9

## 2020-10-28 HISTORY — DX: Pneumonia, unspecified organism: J18.9

## 2020-10-28 HISTORY — DX: Depression, unspecified: F32.A

## 2020-10-28 LAB — GLUCOSE, CAPILLARY: Glucose-Capillary: 173 mg/dL — ABNORMAL HIGH (ref 70–99)

## 2020-10-28 LAB — CBC WITH DIFFERENTIAL/PLATELET
Abs Immature Granulocytes: 0.03 10*3/uL (ref 0.00–0.07)
Basophils Absolute: 0.1 10*3/uL (ref 0.0–0.1)
Basophils Relative: 1 %
Eosinophils Absolute: 0.2 10*3/uL (ref 0.0–0.5)
Eosinophils Relative: 2 %
HCT: 41.4 % (ref 39.0–52.0)
Hemoglobin: 13.9 g/dL (ref 13.0–17.0)
Immature Granulocytes: 0 %
Lymphocytes Relative: 22 %
Lymphs Abs: 2.1 10*3/uL (ref 0.7–4.0)
MCH: 31.4 pg (ref 26.0–34.0)
MCHC: 33.6 g/dL (ref 30.0–36.0)
MCV: 93.7 fL (ref 80.0–100.0)
Monocytes Absolute: 0.6 10*3/uL (ref 0.1–1.0)
Monocytes Relative: 6 %
Neutro Abs: 6.6 10*3/uL (ref 1.7–7.7)
Neutrophils Relative %: 69 %
Platelets: 267 10*3/uL (ref 150–400)
RBC: 4.42 MIL/uL (ref 4.22–5.81)
RDW: 13.2 % (ref 11.5–15.5)
WBC: 9.6 10*3/uL (ref 4.0–10.5)
nRBC: 0 % (ref 0.0–0.2)

## 2020-10-28 LAB — COMPREHENSIVE METABOLIC PANEL
ALT: 14 U/L (ref 0–44)
AST: 15 U/L (ref 15–41)
Albumin: 4.2 g/dL (ref 3.5–5.0)
Alkaline Phosphatase: 49 U/L (ref 38–126)
Anion gap: 9 (ref 5–15)
BUN: 13 mg/dL (ref 8–23)
CO2: 24 mmol/L (ref 22–32)
Calcium: 9.7 mg/dL (ref 8.9–10.3)
Chloride: 106 mmol/L (ref 98–111)
Creatinine, Ser: 0.82 mg/dL (ref 0.61–1.24)
GFR, Estimated: >60 mL/min (ref >60)
Glucose, Bld: 182 mg/dL — ABNORMAL HIGH (ref 70–99)
Potassium: 4 mmol/L (ref 3.5–5.1)
Sodium: 139 mmol/L (ref 135–145)
Total Bilirubin: 0.9 mg/dL (ref 0.3–1.2)
Total Protein: 7.4 g/dL (ref 6.5–8.1)

## 2020-10-28 LAB — PROTIME-INR
INR: 1 (ref 0.8–1.2)
Prothrombin Time: 12.7 seconds (ref 11.4–15.2)

## 2020-10-28 LAB — SURGICAL PCR SCREEN
MRSA, PCR: NEGATIVE
Staphylococcus aureus: NEGATIVE

## 2020-10-28 LAB — APTT: aPTT: 28 seconds (ref 24–36)

## 2020-10-28 LAB — HEMOGLOBIN A1C
Hgb A1c MFr Bld: 6.5 % — ABNORMAL HIGH (ref 4.8–5.6)
Mean Plasma Glucose: 139.85 mg/dL

## 2020-10-28 NOTE — Progress Notes (Signed)
COVID Vaccine Completed: Yes Date COVID Vaccine completed: 02/18/20 COVID vaccine manufacturer: Pfizer      PCP - Dr. Wenda Low Cardiologist -   Chest x-ray - 01/20/20 EKG -  Stress Test -  ECHO -  Cardiac Cath -  Pacemaker/ICD device last checked:  Sleep Study - Yes CPAP - Yes  Fasting Blood Sugar - 100's Checks Blood Sugar ___1__ times a day  Blood Thinner Instructions: Aspirin Instructions: Last Dose:  Anesthesia review: HX: OSA(CPAP)diaphoresis,HTN  Patient denies shortness of breath, fever, cough and chest pain at PAT appointment   Patient verbalized understanding of instructions that were given to them at the PAT appointment. Patient was also instructed that they will need to review over the PAT instructions again at home before surgery.

## 2020-10-29 ENCOUNTER — Other Ambulatory Visit (HOSPITAL_COMMUNITY)
Admission: RE | Admit: 2020-10-29 | Discharge: 2020-10-29 | Disposition: A | Discharge status: Home / Self Care | Payer: Medicare Other | Source: Ambulatory Visit | Attending: Orthopedic Surgery | Admitting: Orthopedic Surgery

## 2020-10-29 DIAGNOSIS — Z20822 Contact with and (suspected) exposure to covid-19: Secondary | ICD-10-CM | POA: Insufficient documentation

## 2020-10-29 DIAGNOSIS — Z01812 Encounter for preprocedural laboratory examination: Secondary | ICD-10-CM | POA: Diagnosis present

## 2020-10-29 LAB — URINE CULTURE: Culture: NO GROWTH

## 2020-10-29 LAB — SARS CORONAVIRUS 2 (TAT 6-24 HRS): SARS Coronavirus 2: NEGATIVE

## 2020-11-01 MED ORDER — BUPIVACAINE LIPOSOME 1.3 % IJ SUSP
20.0000 mL | Freq: Once | INTRAMUSCULAR | Status: DC
  Filled 2020-11-01: qty 20

## 2020-11-02 ENCOUNTER — Ambulatory Visit (HOSPITAL_COMMUNITY): Payer: Medicare Other | Admitting: Anesthesiology

## 2020-11-02 ENCOUNTER — Encounter (HOSPITAL_COMMUNITY)
Admission: RE | Disposition: A | Discharge status: Home / Self Care | Payer: Self-pay | Source: Home / Self Care | Attending: Orthopedic Surgery

## 2020-11-02 ENCOUNTER — Encounter (HOSPITAL_COMMUNITY): Payer: Self-pay | Admitting: Orthopedic Surgery

## 2020-11-02 ENCOUNTER — Ambulatory Visit (HOSPITAL_COMMUNITY)
Admission: RE | Admit: 2020-11-02 | Discharge: 2020-11-02 | Disposition: A | Discharge status: Home / Self Care | Payer: Medicare Other | Attending: Orthopedic Surgery | Admitting: Orthopedic Surgery

## 2020-11-02 ENCOUNTER — Other Ambulatory Visit (HOSPITAL_BASED_OUTPATIENT_CLINIC_OR_DEPARTMENT_OTHER): Payer: Self-pay

## 2020-11-02 DIAGNOSIS — Z7984 Long term (current) use of oral hypoglycemic drugs: Secondary | ICD-10-CM | POA: Insufficient documentation

## 2020-11-02 DIAGNOSIS — Z79899 Other long term (current) drug therapy: Secondary | ICD-10-CM | POA: Diagnosis not present

## 2020-11-02 DIAGNOSIS — M25461 Effusion, right knee: Secondary | ICD-10-CM | POA: Diagnosis not present

## 2020-11-02 DIAGNOSIS — Z96652 Presence of left artificial knee joint: Secondary | ICD-10-CM | POA: Insufficient documentation

## 2020-11-02 DIAGNOSIS — K5900 Constipation, unspecified: Secondary | ICD-10-CM | POA: Diagnosis present

## 2020-11-02 DIAGNOSIS — F330 Major depressive disorder, recurrent, mild: Secondary | ICD-10-CM | POA: Insufficient documentation

## 2020-11-02 DIAGNOSIS — Z8546 Personal history of malignant neoplasm of prostate: Secondary | ICD-10-CM

## 2020-11-02 DIAGNOSIS — C61 Malignant neoplasm of prostate: Secondary | ICD-10-CM | POA: Diagnosis present

## 2020-11-02 DIAGNOSIS — E119 Type 2 diabetes mellitus without complications: Secondary | ICD-10-CM

## 2020-11-02 DIAGNOSIS — F419 Anxiety disorder, unspecified: Secondary | ICD-10-CM | POA: Diagnosis present

## 2020-11-02 DIAGNOSIS — M1711 Unilateral primary osteoarthritis, right knee: Secondary | ICD-10-CM | POA: Diagnosis not present

## 2020-11-02 DIAGNOSIS — M25761 Osteophyte, right knee: Secondary | ICD-10-CM | POA: Insufficient documentation

## 2020-11-02 DIAGNOSIS — J309 Allergic rhinitis, unspecified: Secondary | ICD-10-CM | POA: Diagnosis present

## 2020-11-02 DIAGNOSIS — I10 Essential (primary) hypertension: Secondary | ICD-10-CM | POA: Diagnosis present

## 2020-11-02 DIAGNOSIS — E669 Obesity, unspecified: Secondary | ICD-10-CM | POA: Diagnosis present

## 2020-11-02 DIAGNOSIS — F325 Major depressive disorder, single episode, in full remission: Secondary | ICD-10-CM | POA: Diagnosis present

## 2020-11-02 DIAGNOSIS — Z6830 Body mass index (BMI) 30.0-30.9, adult: Secondary | ICD-10-CM | POA: Diagnosis not present

## 2020-11-02 DIAGNOSIS — E1142 Type 2 diabetes mellitus with diabetic polyneuropathy: Secondary | ICD-10-CM | POA: Diagnosis present

## 2020-11-02 DIAGNOSIS — Z885 Allergy status to narcotic agent status: Secondary | ICD-10-CM | POA: Insufficient documentation

## 2020-11-02 DIAGNOSIS — M8568 Other cyst of bone, other site: Secondary | ICD-10-CM | POA: Diagnosis not present

## 2020-11-02 DIAGNOSIS — Z981 Arthrodesis status: Secondary | ICD-10-CM | POA: Diagnosis not present

## 2020-11-02 DIAGNOSIS — R972 Elevated prostate specific antigen [PSA]: Secondary | ICD-10-CM | POA: Diagnosis present

## 2020-11-02 DIAGNOSIS — G4730 Sleep apnea, unspecified: Secondary | ICD-10-CM | POA: Diagnosis present

## 2020-11-02 DIAGNOSIS — K219 Gastro-esophageal reflux disease without esophagitis: Secondary | ICD-10-CM | POA: Diagnosis present

## 2020-11-02 DIAGNOSIS — G4733 Obstructive sleep apnea (adult) (pediatric): Secondary | ICD-10-CM | POA: Diagnosis present

## 2020-11-02 HISTORY — DX: Ileus, unspecified: K56.7

## 2020-11-02 HISTORY — PX: TOTAL KNEE ARTHROPLASTY: SHX125

## 2020-11-02 HISTORY — DX: Ileus, unspecified: K91.89

## 2020-11-02 LAB — GLUCOSE, CAPILLARY
Glucose-Capillary: 121 mg/dL — ABNORMAL HIGH (ref 70–99)
Glucose-Capillary: 153 mg/dL — ABNORMAL HIGH (ref 70–99)

## 2020-11-02 SURGERY — ARTHROPLASTY, KNEE, TOTAL
Anesthesia: Regional | Site: Knee | Laterality: Right

## 2020-11-02 MED ORDER — SODIUM CHLORIDE (PF) 0.9 % IJ SOLN
INTRAMUSCULAR | Status: DC | PRN
  Administered 2020-11-02: 50 mL

## 2020-11-02 MED ORDER — ONDANSETRON HCL 4 MG/2ML IJ SOLN
INTRAMUSCULAR | Status: DC | PRN
  Administered 2020-11-02: 4 mg via INTRAVENOUS

## 2020-11-02 MED ORDER — ORAL CARE MOUTH RINSE: 15.0000 mL | Freq: Once | OROMUCOSAL | Status: AC

## 2020-11-02 MED ORDER — POVIDONE-IODINE 7.5 % EX SOLN: Freq: Once | CUTANEOUS | Status: DC

## 2020-11-02 MED ORDER — ASPIRIN EC 325 MG PO TBEC
DELAYED_RELEASE_TABLET | ORAL | 0 refills | Status: AC
  Filled 2020-11-02: qty 100, 100d supply, fill #0

## 2020-11-02 MED ORDER — CLINDAMYCIN PHOSPHATE 900 MG/50ML IV SOLN
900.0000 mg | INTRAVENOUS | Status: AC
  Administered 2020-11-02: 900 mg via INTRAVENOUS
  Filled 2020-11-02: qty 50

## 2020-11-02 MED ORDER — CHLORHEXIDINE GLUCONATE 0.12 % MT SOLN
15.0000 mL | Freq: Once | OROMUCOSAL | Status: AC
  Administered 2020-11-02: 15 mL via OROMUCOSAL

## 2020-11-02 MED ORDER — WATER FOR IRRIGATION, STERILE IR SOLN
Status: DC | PRN
  Administered 2020-11-02: 2000 mL

## 2020-11-02 MED ORDER — FENTANYL CITRATE (PF) 100 MCG/2ML IJ SOLN
INTRAMUSCULAR | Status: DC | PRN
  Administered 2020-11-02 (×3): 50 ug via INTRAVENOUS

## 2020-11-02 MED ORDER — SODIUM CHLORIDE 0.9 % IR SOLN
Status: DC | PRN
  Administered 2020-11-02: 2000 mL

## 2020-11-02 MED ORDER — FENTANYL CITRATE (PF) 100 MCG/2ML IJ SOLN
50.0000 ug | Freq: Once | INTRAMUSCULAR | Status: DC
  Filled 2020-11-02: qty 2

## 2020-11-02 MED ORDER — LACTATED RINGERS IV BOLUS: 500.0000 mL | Freq: Once | INTRAVENOUS | Status: DC

## 2020-11-02 MED ORDER — FENTANYL CITRATE (PF) 100 MCG/2ML IJ SOLN
INTRAMUSCULAR | Status: AC
  Filled 2020-11-02: qty 2

## 2020-11-02 MED ORDER — GLYCOPYRROLATE PF 0.2 MG/ML IJ SOSY
PREFILLED_SYRINGE | INTRAMUSCULAR | Status: AC
  Filled 2020-11-02: qty 1

## 2020-11-02 MED ORDER — AMISULPRIDE (ANTIEMETIC) 5 MG/2ML IV SOLN: 10.0000 mg | Freq: Once | INTRAVENOUS | Status: DC | PRN

## 2020-11-02 MED ORDER — CEFAZOLIN SODIUM-DEXTROSE 2-4 GM/100ML-% IV SOLN
2.0000 g | Freq: Four times a day (QID) | INTRAVENOUS | Status: DC
Start: 2020-11-02 — End: 2020-11-02

## 2020-11-02 MED ORDER — BUPIVACAINE IN DEXTROSE 0.75-8.25 % IT SOLN
INTRATHECAL | Status: DC | PRN
  Administered 2020-11-02: 1.6 mL via INTRATHECAL

## 2020-11-02 MED ORDER — LACTATED RINGERS IV SOLN: INTRAVENOUS | Status: DC

## 2020-11-02 MED ORDER — LIDOCAINE 2% (20 MG/ML) 5 ML SYRINGE
INTRAMUSCULAR | Status: DC | PRN
  Administered 2020-11-02: 50 mg via INTRAVENOUS

## 2020-11-02 MED ORDER — PROPOFOL 10 MG/ML IV BOLUS
INTRAVENOUS | Status: DC | PRN
  Administered 2020-11-02: 75 mg via INTRAVENOUS
  Administered 2020-11-02: 15 mg via INTRAVENOUS
  Administered 2020-11-02: 75 mg via INTRAVENOUS

## 2020-11-02 MED ORDER — POVIDONE-IODINE 10 % EX SWAB
2.0000 "application " | Freq: Once | CUTANEOUS | Status: AC
  Administered 2020-11-02: 2 via TOPICAL

## 2020-11-02 MED ORDER — ONDANSETRON HCL 4 MG/2ML IJ SOLN: 4.0000 mg | Freq: Once | INTRAMUSCULAR | Status: DC | PRN

## 2020-11-02 MED ORDER — TRANEXAMIC ACID-NACL 1000-0.7 MG/100ML-% IV SOLN
1000.0000 mg | INTRAVENOUS | Status: AC
  Administered 2020-11-02: 1000 mg via INTRAVENOUS
  Filled 2020-11-02: qty 100

## 2020-11-02 MED ORDER — ACETAMINOPHEN 500 MG PO TABS
1000.0000 mg | ORAL_TABLET | Freq: Once | ORAL | Status: AC
  Administered 2020-11-02: 1000 mg via ORAL
  Filled 2020-11-02: qty 2

## 2020-11-02 MED ORDER — 0.9 % SODIUM CHLORIDE (POUR BTL) OPTIME
TOPICAL | Status: DC | PRN
  Administered 2020-11-02: 1000 mL

## 2020-11-02 MED ORDER — PROPOFOL 1000 MG/100ML IV EMUL
INTRAVENOUS | Status: AC
  Filled 2020-11-02: qty 100

## 2020-11-02 MED ORDER — LIDOCAINE 2% (20 MG/ML) 5 ML SYRINGE
INTRAMUSCULAR | Status: AC
  Filled 2020-11-02: qty 5

## 2020-11-02 MED ORDER — BUPIVACAINE-EPINEPHRINE 0.25% -1:200000 IJ SOLN
INTRAMUSCULAR | Status: DC | PRN
  Administered 2020-11-02: 30 mL

## 2020-11-02 MED ORDER — EPHEDRINE SULFATE-NACL 50-0.9 MG/10ML-% IV SOSY
PREFILLED_SYRINGE | INTRAVENOUS | Status: DC | PRN
  Administered 2020-11-02 (×2): 10 mg via INTRAVENOUS

## 2020-11-02 MED ORDER — DOCUSATE SODIUM 100 MG PO CAPS
ORAL_CAPSULE | ORAL | 3 refills | Status: AC
  Filled 2020-11-02: qty 100, 50d supply, fill #0

## 2020-11-02 MED ORDER — POLYETHYLENE GLYCOL 3350 17 GM/SCOOP PO POWD
ORAL | 11 refills | Status: AC
  Filled 2020-11-02: qty 510, 30d supply, fill #0
  Filled 2020-12-11: qty 510, 30d supply, fill #1

## 2020-11-02 MED ORDER — ONDANSETRON HCL 4 MG/2ML IJ SOLN
INTRAMUSCULAR | Status: AC
  Filled 2020-11-02: qty 2

## 2020-11-02 MED ORDER — DEXAMETHASONE SODIUM PHOSPHATE 10 MG/ML IJ SOLN
INTRAMUSCULAR | Status: AC
  Filled 2020-11-02: qty 1

## 2020-11-02 MED ORDER — GABAPENTIN 300 MG PO CAPS
ORAL_CAPSULE | ORAL | 0 refills | Status: AC
  Filled 2020-11-02: qty 30, 30d supply, fill #0

## 2020-11-02 MED ORDER — PHENYLEPHRINE HCL (PRESSORS) 10 MG/ML IV SOLN
INTRAVENOUS | Status: AC
  Filled 2020-11-02: qty 1

## 2020-11-02 MED ORDER — PROPOFOL 500 MG/50ML IV EMUL
INTRAVENOUS | Status: DC | PRN
  Administered 2020-11-02: 75 ug/kg/min via INTRAVENOUS

## 2020-11-02 MED ORDER — OXYCODONE HCL 5 MG PO TABS
5.0000 mg | ORAL_TABLET | ORAL | 0 refills | Status: AC | PRN
  Filled 2020-11-02: qty 42, 7d supply, fill #0

## 2020-11-02 MED ORDER — TRANEXAMIC ACID-NACL 1000-0.7 MG/100ML-% IV SOLN: 1000.0000 mg | Freq: Once | INTRAVENOUS | Status: DC

## 2020-11-02 MED ORDER — FENTANYL CITRATE (PF) 100 MCG/2ML IJ SOLN: 25.0000 ug | INTRAMUSCULAR | Status: DC | PRN

## 2020-11-02 MED ORDER — LACTATED RINGERS IV BOLUS: 250.0000 mL | Freq: Once | INTRAVENOUS | Status: DC

## 2020-11-02 MED ORDER — BUPIVACAINE-EPINEPHRINE (PF) 0.25% -1:200000 IJ SOLN
INTRAMUSCULAR | Status: AC
  Filled 2020-11-02: qty 30

## 2020-11-02 MED ORDER — BUPIVACAINE-EPINEPHRINE (PF) 0.5% -1:200000 IJ SOLN
INTRAMUSCULAR | Status: DC | PRN
  Administered 2020-11-02: 30 mL via PERINEURAL

## 2020-11-02 MED ORDER — MIDAZOLAM HCL 2 MG/2ML IJ SOLN
1.0000 mg | Freq: Once | INTRAMUSCULAR | Status: AC
  Administered 2020-11-02: 1 mg via INTRAVENOUS
  Filled 2020-11-02: qty 2

## 2020-11-02 MED ORDER — DEXAMETHASONE SODIUM PHOSPHATE 10 MG/ML IJ SOLN
8.0000 mg | Freq: Once | INTRAMUSCULAR | Status: AC
  Administered 2020-11-02: 8 mg via INTRAVENOUS

## 2020-11-02 MED ORDER — LACTATED RINGERS IV BOLUS
500.0000 mL | Freq: Once | INTRAVENOUS | Status: AC
  Administered 2020-11-02: 500 mL via INTRAVENOUS

## 2020-11-02 MED ORDER — POVIDONE-IODINE 10 % EX SWAB: 2.0000 "application " | Freq: Once | CUTANEOUS | Status: DC

## 2020-11-02 MED ORDER — BUPIVACAINE LIPOSOME 1.3 % IJ SUSP
INTRAMUSCULAR | Status: DC | PRN
  Administered 2020-11-02: 20 mL

## 2020-11-02 SURGICAL SUPPLY — 58 items
APL PRP STRL LF DISP 70% ISPRP (MISCELLANEOUS) ×2
ATTUNE MED DOME PAT 38 KNEE (Knees) ×1 IMPLANT
ATTUNE PS FEM RT SZ 8 CEM KNEE (Femur) ×1 IMPLANT
ATTUNE PSRP INSR SZ8 7 KNEE (Insert) ×1 IMPLANT
BAG SPEC THK2 15X12 ZIP CLS (MISCELLANEOUS) ×1
BAG ZIPLOCK 12X15 (MISCELLANEOUS) ×2 IMPLANT
BASE TIBIAL ROT PLAT SZ 8 KNEE (Knees) IMPLANT
BLADE SAGITTAL 25.0X1.19X90 (BLADE) ×2 IMPLANT
BLADE SAW SGTL 13X75X1.27 (BLADE) ×2 IMPLANT
BLADE SURG SZ10 CARB STEEL (BLADE) ×4 IMPLANT
BOWL SMART MIX CTS (DISPOSABLE) ×2 IMPLANT
BSPLAT TIB 8 CMNT ROT PLAT STR (Knees) ×1 IMPLANT
CEMENT HV SMART SET (Cement) ×4 IMPLANT
CHLORAPREP W/TINT 26 (MISCELLANEOUS) ×4 IMPLANT
COVER SURGICAL LIGHT HANDLE (MISCELLANEOUS) ×2 IMPLANT
COVER WAND RF STERILE (DRAPES) IMPLANT
CUFF TOURN SGL QUICK 34 (TOURNIQUET CUFF) ×2
CUFF TRNQT CYL 34X4.125X (TOURNIQUET CUFF) ×1 IMPLANT
DECANTER SPIKE VIAL GLASS SM (MISCELLANEOUS) ×4 IMPLANT
DRAPE ORTHO 2.5IN SPLIT 77X108 (DRAPES) ×1 IMPLANT
DRAPE ORTHO SPLIT 77X108 STRL (DRAPES) ×2
DRAPE SHEET LG 3/4 BI-LAMINATE (DRAPES) ×2 IMPLANT
DRAPE U-SHAPE 47X51 STRL (DRAPES) ×2 IMPLANT
DRSG AQUACEL AG ADV 3.5X10 (GAUZE/BANDAGES/DRESSINGS) ×2 IMPLANT
DRSG PAD ABDOMINAL 8X10 ST (GAUZE/BANDAGES/DRESSINGS) ×4 IMPLANT
ELECT REM PT RETURN 15FT ADLT (MISCELLANEOUS) ×2 IMPLANT
GLOVE SURG ENC MOIS LTX SZ7 (GLOVE) ×2 IMPLANT
GLOVE SURG MICRO LTX SZ7.5 (GLOVE) ×2 IMPLANT
GLOVE SURG UNDER POLY LF SZ7 (GLOVE) ×2 IMPLANT
GLOVE SURG UNDER POLY LF SZ7.5 (GLOVE) ×2 IMPLANT
GOWN STRL REUS W/ TWL LRG LVL3 (GOWN DISPOSABLE) ×2 IMPLANT
GOWN STRL REUS W/TWL LRG LVL3 (GOWN DISPOSABLE) ×4
HANDPIECE INTERPULSE COAX TIP (DISPOSABLE) ×2
HOLDER FOLEY CATH W/STRAP (MISCELLANEOUS) IMPLANT
HOOD PEEL AWAY FLYTE STAYCOOL (MISCELLANEOUS) ×6 IMPLANT
KIT TURNOVER KIT A (KITS) ×2 IMPLANT
MANIFOLD NEPTUNE II (INSTRUMENTS) ×2 IMPLANT
MARKER SKIN DUAL TIP RULER LAB (MISCELLANEOUS) ×2 IMPLANT
NDL SAFETY ECLIPSE 18X1.5 (NEEDLE) ×1 IMPLANT
NEEDLE HYPO 18GX1.5 SHARP (NEEDLE) ×2
NS IRRIG 1000ML POUR BTL (IV SOLUTION) ×2 IMPLANT
PACK TOTAL KNEE CUSTOM (KITS) ×2 IMPLANT
PENCIL SMOKE EVACUATOR (MISCELLANEOUS) ×2 IMPLANT
PIN DRILL FIX HALF THREAD (BIT) ×1 IMPLANT
PIN STEINMAN FIXATION KNEE (PIN) ×1 IMPLANT
PROTECTOR NERVE ULNAR (MISCELLANEOUS) ×2 IMPLANT
SET HNDPC FAN SPRY TIP SCT (DISPOSABLE) ×1 IMPLANT
STAPLER VISISTAT 35W (STAPLE) IMPLANT
STRIP CLOSURE SKIN 1/2X4 (GAUZE/BANDAGES/DRESSINGS) ×2 IMPLANT
SUT MNCRL AB 3-0 PS2 18 (SUTURE) ×2 IMPLANT
SUT VIC AB 0 CT1 36 (SUTURE) ×2 IMPLANT
SUT VIC AB 1 CT1 36 (SUTURE) ×2 IMPLANT
SUT VIC AB 2-0 CT1 27 (SUTURE) ×4
SUT VIC AB 2-0 CT1 TAPERPNT 27 (SUTURE) ×2 IMPLANT
SYR 30ML LL (SYRINGE) ×4 IMPLANT
TIBIAL BASE ROT PLAT SZ 8 KNEE (Knees) ×2 IMPLANT
TRAY FOLEY MTR SLVR 14FR STAT (SET/KITS/TRAYS/PACK) ×2 IMPLANT
WATER STERILE IRR 1000ML POUR (IV SOLUTION) ×4 IMPLANT

## 2020-11-02 NOTE — Anesthesia Preprocedure Evaluation (Addendum)
Anesthesia Evaluation  Patient identified by MRN, date of birth, ID band Patient awake    Reviewed: Allergy & Precautions, NPO status , Patient's Chart, lab work & pertinent test results  Airway Mallampati: III  TM Distance: >3 FB Neck ROM: Full    Dental  (+) Chipped,    Pulmonary sleep apnea and Continuous Positive Airway Pressure Ventilation ,    Pulmonary exam normal breath sounds clear to auscultation       Cardiovascular hypertension, Pt. on medications Normal cardiovascular exam Rhythm:Regular Rate:Normal  ECG: SR, rate 74. Normal sinus rhythm Left axis deviation   Neuro/Psych PSYCHIATRIC DISORDERS Anxiety Depression  Neuromuscular disease    GI/Hepatic negative GI ROS, Neg liver ROS,   Endo/Other  diabetes, Oral Hypoglycemic Agents  Renal/GU negative Renal ROS     Musculoskeletal  (+) Arthritis ,   Abdominal (+) + obese,   Peds  Hematology HLD   Anesthesia Other Findings DJD RIGHT KNEE  Reproductive/Obstetrics                            Anesthesia Physical Anesthesia Plan  ASA: 3  Anesthesia Plan: Spinal and Regional   Post-op Pain Management:  Regional for Post-op pain   Induction:   PONV Risk Score and Plan: 1 and Ondansetron, Dexamethasone, Propofol infusion and Treatment may vary due to age or medical condition  Airway Management Planned: Simple Face Mask  Additional Equipment:   Intra-op Plan:   Post-operative Plan:   Informed Consent: I have reviewed the patients History and Physical, chart, labs and discussed the procedure including the risks, benefits and alternatives for the proposed anesthesia with the patient or authorized representative who has indicated his/her understanding and acceptance.     Dental advisory given  Plan Discussed with: CRNA  Anesthesia Plan Comments:         Anesthesia Quick Evaluation

## 2020-11-02 NOTE — Progress Notes (Signed)
AssistedDr. Ellender with right, ultrasound guided, adductor canal block. Side rails up, monitors on throughout procedure. See vital signs in flow sheet. Tolerated Procedure well.  

## 2020-11-02 NOTE — Anesthesia Postprocedure Evaluation (Signed)
Anesthesia Post Note  Patient: [name]  Procedure(s) Performed: TOTAL KNEE ARTHROPLASTY (Right: Knee)     Patient location during evaluation: PACU Anesthesia Type: Regional and General Level of consciousness: awake Pain management: pain level controlled Vital Signs Assessment: post-procedure vital signs reviewed and stable Respiratory status: spontaneous breathing, nonlabored ventilation, respiratory function stable and patient connected to nasal cannula oxygen Cardiovascular status: blood pressure returned to baseline and stable Postop Assessment: no apparent nausea or vomiting Anesthetic complications: no   No notable events documented.  Last Vitals:  Vitals:   11/02/20 1310 11/02/20 1410  BP: (!) 170/77 (!) 171/72  Pulse: 86 86  Resp: 16 16  Temp:    SpO2: 97% 97%    Last Pain:  Vitals:   11/02/20 1410  TempSrc:   PainSc: 0-No pain                 Sona Nations P Holbert Caples

## 2020-11-02 NOTE — Progress Notes (Signed)
Physical Therapy Evaluation Patient Details Name: [name] MRN: [id number] DOB: Apr 12, 1951 Today's Date: 11/02/2020      Clinical Impression  [name] is a 70 y.o. male POD 0 s/p Rt TKA. Patient reports independence with mobility at baseline. Patient is now limited by functional impairments (see PT problem list below) and requires min guard/supervision for transfers and gait with RW. Patient was able to ambulate ~80 feet with RW and min guard/supervision and cues for safe walker management. Patient instructed in exercises to facilitate ROM and circulation. Patient will benefit from continued skilled PT interventions to address impairments and progress towards PLOF. Patient has met mobility goals at adequate level for discharge home; will continue to follow if pt continues acute stay to progress towards Mod I goals.        11/02/20 1500  PT Visit Information  Last PT Received On 11/02/20  Assistance Needed +1  History of Present Illness Patient is 70 y.o. male s/p Rt TKA on 11/02/20 with PMH significant for OA, depression, anxiety, HLD, HTN, DMII, ACDF, Lt TKA in 2008.  Precautions  Precautions Fall  Restrictions  Weight Bearing Restrictions No  Other Position/Activity Restrictions WBAT  Home Living  Family/patient expects to be discharged to: Private residence  Living Arrangements Spouse/significant other  Available Help at Discharge Family  Type of [place] Access Level entry;Elevator  [place] One level  Bathroom Shower/Tub Walk-in shower  [place] BSC;Cane - single point  Prior Function  Level of Independence Independent  Communication  Communication No difficulties  Pain Assessment  Pain Assessment 0-10  Pain Score 1  Pain Location Rt knee  Pain Descriptors / Indicators Aching;Discomfort  Pain Intervention(s) Limited activity within patient's tolerance;Repositioned;Ice  applied;Monitored during session  Cognition  Arousal/Alertness Awake/alert  Behavior During Therapy WFL for tasks assessed/performed  Overall Cognitive Status Within Functional Limits for tasks assessed  Upper Extremity Assessment  Upper Extremity Assessment Overall WFL for tasks assessed  Lower Extremity Assessment  Lower Extremity Assessment RLE deficits/detail  RLE Deficits / Details good quad activation, no extensor lag with SLR  RLE Sensation WNL  RLE Coordination WNL  Cervical / Trunk Assessment  Cervical / Trunk Assessment Normal  Bed Mobility  Overal bed mobility Needs Assistance  Bed Mobility Supine to Sit  Supine to sit Min guard;HOB elevated  General bed mobility comments cues to initiate sitting up EOB, no assist required to sit up, pt needs extra time  Transfers  Overall transfer level Needs assistance  Equipment used Rolling walker (2 wheeled)  Transfers Sit to/from Stand  Sit to Stand Supervision;Min guard  General transfer comment cues for hand placement with RW, no assist required for power up.  Ambulation/Gait  Ambulation/Gait assistance Min guard;Supervision  Gait Distance (Feet) 80 Feet  Assistive device Rolling walker (2 wheeled)  Gait Pattern/deviations Step-to pattern;Decreased stride length;Decreased weight shift to right  General Gait Details cues for step pattern and proximity to RW, no overt LOB throughout. pt progressed to min guard.supervision throughout  Gait velocity decr  Balance  Overall balance assessment Mild deficits observed, not formally tested;Needs assistance  Sitting-balance support Feet supported  Sitting balance-Leahy Scale Good  Standing balance support During functional activity;Bilateral upper extremity supported  Standing balance-Leahy Scale Fair  Exercises  Exercises Total Joint  Total Joint Exercises  Ankle Circles/Pumps AROM;Both;20 reps;Seated  Quad Sets AROM;Right;Seated;10 reps  Short Arc Quad AROM;Right;5 reps;Seated   Heel Slides AROM;Right;5  reps;Seated  Hip ABduction/ADduction AROM;Right;5 reps;Seated  PT - End of Session  Equipment Utilized During Treatment Gait belt  Activity Tolerance Patient tolerated treatment well  Patient left in chair;with call bell/phone within reach  Nurse Communication Mobility status  PT Assessment  PT Recommendation/Assessment Patient needs continued PT services  PT Visit Diagnosis Muscle weakness (generalized) (M62.81);Difficulty in walking, not elsewhere classified (R26.2)  PT Problem List Decreased strength;Decreased range of motion;Decreased activity tolerance;Decreased balance;Decreased mobility;Decreased knowledge of use of DME;Decreased knowledge of precautions;Decreased safety awareness  PT Plan  PT Frequency (ACUTE ONLY) 7X/week  PT Treatment/Interventions (ACUTE ONLY) DME instruction;Gait training;Stair training;Functional mobility training;Therapeutic activities;Therapeutic exercise;Balance training;Neuromuscular re-education;Patient/family education  AM-PAC PT "6 Clicks" Mobility Outcome Measure (Version 2)  Help needed turning from your back to your side while in a flat bed without using bedrails? 3  Help needed moving from lying on your back to sitting on the side of a flat bed without using bedrails? 3  Help needed moving to and from a bed to a chair (including a wheelchair)? 3  Help needed standing up from a chair using your arms (e.g., wheelchair or bedside chair)? 3  Help needed to walk in hospital room? 3  Help needed climbing 3-5 steps with a railing?  3  6 Click Score 18  Consider Recommendation of Discharge To: Home with [facility]  PT Recommendation  Follow Up Recommendations Follow surgeon's recommendation for DC plan and follow-up therapies;Outpatient PT  PT equipment Rolling walker with 5" wheels (in PACU)  Individuals Consulted  Consulted and Agree with Results and Recommendations Patient  Acute Rehab PT Goals  Patient Stated Goal get home  PT  Goal Formulation With patient  Time For Goal Achievement 11/09/20  Potential to Achieve Goals Good  PT Time Calculation  PT Start Time (ACUTE ONLY) 1500  PT Stop Time (ACUTE ONLY) 1540  PT Time Calculation (min) (ACUTE ONLY) 40 min  PT General Charges  $$ ACUTE PT VISIT 1 Visit  PT Evaluation  $PT Eval Low Complexity 1 Low  PT Treatments  $Gait Training 8-22 mins  $Therapeutic Exercise 8-22 mins  Written Expression  Dominant Hand Right    Verner Mould, DPT Acute Rehabilitation Services Office [phone] Pager [phone]   Jacques Navy 11/02/2020, 8:22 PM

## 2020-11-02 NOTE — Transfer of Care (Signed)
Immediate Anesthesia Transfer of Care Note  Patient: JAQUEL GLASSBURN  Procedure(s) Performed: TOTAL KNEE ARTHROPLASTY (Right: Knee)  Patient Location: PACU  Anesthesia Type:General  Level of Consciousness: drowsy  Airway & Oxygen Therapy: Patient Spontanous Breathing and Patient connected to face mask  Post-op Assessment: Report given to RN and Post -op Vital signs reviewed and stable  Post vital signs: Reviewed and stable  Last Vitals:  Vitals Value Taken Time  BP 156/69 11/02/20 1220  Temp    Pulse 81 11/02/20 1222  Resp 17 11/02/20 1222  SpO2 100 % 11/02/20 1222  Vitals shown include unvalidated device data.  Last Pain:  Vitals:   11/02/20 0801  TempSrc: Oral  PainSc:          Complications: No notable events documented.

## 2020-11-02 NOTE — Interval H&P Note (Signed)
History and Physical Interval Note:  11/02/2020 8:58 AM  [name]  has presented today for surgery, with the diagnosis of DJD RIGHT KNEE.  The various methods of treatment have been discussed with the patient and family. After consideration of risks, benefits and other options for treatment, the patient has consented to  Procedure(s): TOTAL KNEE ARTHROPLASTY (Right) as a surgical intervention.  The patient's history has been reviewed, patient examined, no change in status, stable for surgery.  I have reviewed the patient's chart and labs.  Questions were answered to the patient's satisfaction.     [name]

## 2020-11-02 NOTE — Anesthesia Procedure Notes (Signed)
Spinal  Patient location during procedure: OR Start time: 11/02/2020 10:05 AM End time: 11/02/2020 10:11 AM Reason for block: surgical anesthesia Staffing Performed: anesthesiologist  Anesthesiologist: Murvin Natal, MD Preanesthetic Checklist Completed: patient identified, IV checked, risks and benefits discussed, surgical consent, monitors and equipment checked, pre-op evaluation and timeout performed Spinal Block Patient position: sitting Prep: DuraPrep Patient monitoring: cardiac monitor, continuous pulse ox and blood pressure Approach: midline Location: L4-5 Injection technique: single-shot Needle Needle type: Pencan  Needle gauge: 24 G Needle length: 9 cm Assessment Sensory level: T10 Events: CSF return Additional Notes Functioning IV was confirmed and monitors were applied. Sterile prep and drape, including hand hygiene and sterile gloves were used. The patient was positioned and the spine was prepped. The skin was anesthetized with lidocaine.  Free flow of clear CSF was obtained after multiple attempts prior to injecting local anesthetic into the CSF.  The spinal needle aspirated freely following injection.  The needle was carefully withdrawn.  The patient tolerated the procedure well.

## 2020-11-02 NOTE — Anesthesia Procedure Notes (Signed)
Procedure Name: LMA Insertion Date/Time: 11/02/2020 10:30 AM Performed by: Claudia Desanctis, CRNA Pre-anesthesia Checklist: Emergency Drugs available, Patient identified, Suction available and Patient being monitored Patient Re-evaluated:Patient Re-evaluated prior to induction Oxygen Delivery Method: Circle system utilized Preoxygenation: Pre-oxygenation with 100% oxygen Induction Type: IV induction Ventilation: Mask ventilation without difficulty LMA: LMA inserted LMA Size: 4.0 Number of attempts: 1 Placement Confirmation: positive ETCO2 and breath sounds checked- equal and bilateral Tube secured with: Tape Dental Injury: Teeth and Oropharynx as per pre-operative assessment

## 2020-11-02 NOTE — Anesthesia Procedure Notes (Signed)
Anesthesia Regional Block: Adductor canal block   Pre-Anesthetic Checklist: , timeout performed,  Correct Patient, Correct Site, Correct Laterality,  Correct Procedure,, site marked,  Risks and benefits discussed,  Surgical consent,  Pre-op evaluation,  At surgeon's request and post-op pain management  Laterality: Right  Prep: chloraprep       Needles:  Injection technique: Single-shot  Needle Type: Echogenic Stimulator Needle     Needle Length: 10cm  Needle Gauge: 20     Additional Needles:   Procedures:,,,, ultrasound used (permanent image in chart),,    Narrative:  Start time: 11/02/2020 8:50 AM End time: 11/02/2020 9:00 AM Injection made incrementally with aspirations every 5 mL.  Performed by: Personally  Anesthesiologist: Murvin Natal, MD  Additional Notes: Functioning IV was confirmed and monitors were applied. A time-out was performed. Hand hygiene and sterile gloves were used. The thigh was placed in a frog-leg position and prepped in a sterile fashion. A 11mm 20ga Bbraun echogenic stimulator needle was placed using ultrasound guidance.  Negative aspiration and negative test dose prior to incremental administration of local anesthetic. The patient tolerated the procedure well.

## 2020-11-02 NOTE — Op Note (Addendum)
MRN:     [id number] DOB/AGE:    [birth year] / 70 y.o.       OPERATIVE REPORT   DATE OF PROCEDURE:  11/02/2020      PREOPERATIVE DIAGNOSIS:   Primary Localized Osteoarthritis right Knee       Estimated body mass index is 30.27 kg/m as calculated from the following:   Height as of 10/28/20: 5\' 11"  (1.803 m).   Weight as of this encounter: 98.4 kg.                                                       POSTOPERATIVE DIAGNOSIS:   Same                                                                 PROCEDURE:  Procedure(s): TOTAL KNEE ARTHROPLASTY Using Depuy Attune RP implants #8 Femur, #8Tibia, 67mm  RP bearing, 38 Patella    SURGEON: Alex Heiner A. Noemi Chapel, MD   ASSISTANT: Alex Saras, PA-C, present and scrubbed throughout the case, critical for retraction, instrumentation, and closure.  ANESTHESIA: Spinal with Adductor Nerve Block  TOURNIQUET TIME: 65 minutes   COMPLICATIONS:  None       SPECIMENS: None   INDICATIONS FOR PROCEDURE: The patient has djd of the knee with varus deformities, XR shows bone on bone arthritis. Patient has failed all conservative measures including anti-inflammatory medicines, narcotics, attempts at exercise and weight loss, cortisone injections and viscosupplementation.  Risks and benefits of surgery have been discussed, questions answered.    DESCRIPTION OF PROCEDURE: The patient identified by armband, received right adductor canal block and IV antibiotics, [facility]. Patient taken to the operating room, appropriate anesthetic monitors were attached. Spinal anesthesia induced with the patient in supine position, Foley catheter was inserted. Tourniquet applied high to the operative thigh. Lateral post and foot positioner applied to the table, the lower extremity was then prepped and draped in usual sterile fashion from the ankle to the tourniquet. Time-out procedure was performed. The limb was wrapped with an Esmarch bandage and the  tourniquet inflated to 365 mmHg.   We began the operation by making a 6cm anterior midline incision. Small bleeders in the skin and the subcutaneous tissue identified and cauterized. Transverse retinaculum was incised and reflected medially and a medial parapatellar arthrotomy was accomplished. the patella was everted and theprepatellar fat pad resected. The superficial medial collateral ligament was then elevated from anterior to posterior along the proximal flare of the tibia and anterior half of the menisci resected. The knee was hyperflexed exposing bone on bone arthritis. Peripheral and notch osteophytes as well as the cruciate ligaments were then resected. We continued to work our way around posteriorly along the proximal tibia, and externally rotated the tibia subluxing it out from underneath the femur. A McHale retractor was placed through the notch and a lateral Hohmann retractor placed, and an external tibial guide was placed.  The tibial cutting guide was pinned into place allowing resection of 4 mm of bone medially and about 6 mm of bone laterally because of her varus  deformity.   Satisfied with the tibial resection, we then entered the distal femur 2 mm anterior to the PCL origin with the intramedullary guide rod and applied the distal femoral cutting guide set at 11mm, with 5 degrees of valgus. This was pinned along the epicondylar axis. At this point, the distal femoral cut was accomplished without difficulty. We then sized for a 8 femoral component and pinned the guide in 3 degrees of external rotation.The chamfer cutting guide was pinned into place. The anterior, posterior, and chamfer cuts were accomplished without difficulty followed by the  RP box cutting guide and the box cut. We also removed posterior osteophytes from the posterior femoral condyles. At this time, the knee was brought into full extension. We checked our extension and flexion gaps and found them symmetric at 7.  The patella  thickness measured at 86m m. We set the cutting guide at 15 and removed the posterior patella sized for 38 button and drilled the lollipop. The knee was then once again hyperflexed exposing the proximal tibia. We sized for a # 8 tibial base plate, applied the smokestack and the conical reamer followed by the the Delta fin keel punch. We then hammered into place the  RP trial femoral component, inserted a trial bearing, trial patellar button, and took the knee through range of motion from 0-130 degrees. No thumb pressure was required for patellar tracking.   At this point, all trial components were removed, a double batch of DePuy HV cement  was mixed and applied to all bony metallic mating surfaces. In order, we hammered into place the tibial tray and removed excess cement, the femoral component and removed excess cement, a 7 mm  RP bearing was inserted, and the knee brought to full extension with compression. The patellar button was clamped into place, and excess cement removed. While the cement cured the wound was irrigated out with normal saline solution pulse lavage, and exparel was injected throughout the knee. Ligament stability and patellar tracking were checked and found to be excellent..   The parapatellar arthrotomy was closed with  #1 Vicryl suture. The subcutaneous tissue with 0 and 2-0 undyed Vicryl suture, and 4-0 Monocryl.. A dressing of Aquaseal, 4 x 4, dressing sponges, Webril, and Ace wrap applied. Needle and sponge count were correct times 2.The patient awakened, extubated, and taken to recovery room without difficulty. Vascular status was normal, pulses 2+ and symmetric.    [name] 11/02/2020 12:15pm

## 2020-11-03 ENCOUNTER — Encounter (HOSPITAL_COMMUNITY): Payer: Self-pay | Admitting: Orthopedic Surgery

## 2020-11-05 ENCOUNTER — Other Ambulatory Visit: Payer: Self-pay

## 2020-11-05 ENCOUNTER — Other Ambulatory Visit (HOSPITAL_COMMUNITY): Payer: Self-pay | Admitting: Orthopedic Surgery

## 2020-11-05 ENCOUNTER — Other Ambulatory Visit (HOSPITAL_BASED_OUTPATIENT_CLINIC_OR_DEPARTMENT_OTHER): Payer: Self-pay

## 2020-11-05 ENCOUNTER — Ambulatory Visit (HOSPITAL_COMMUNITY)
Admission: RE | Admit: 2020-11-05 | Discharge: 2020-11-05 | Disposition: A | Discharge status: Home / Self Care | Payer: Medicare Other | Source: Ambulatory Visit | Attending: Orthopedic Surgery | Admitting: Orthopedic Surgery

## 2020-11-05 DIAGNOSIS — M79604 Pain in right leg: Secondary | ICD-10-CM | POA: Diagnosis not present

## 2020-11-05 MED ORDER — FLUOXETINE HCL 40 MG PO CAPS
ORAL_CAPSULE | ORAL | 4 refills | Status: AC
  Filled 2020-11-05: qty 90, 90d supply, fill #0
  Filled 2021-02-01: qty 90, 90d supply, fill #1
  Filled 2021-04-28: qty 90, 90d supply, fill #2
  Filled 2021-07-31: qty 90, 90d supply, fill #3
  Filled 2021-10-29: qty 90, 90d supply, fill #4

## 2020-11-05 MED ORDER — CEPHALEXIN 500 MG PO CAPS
ORAL_CAPSULE | ORAL | 0 refills | Status: AC
  Filled 2020-11-05: qty 40, 10d supply, fill #0

## 2020-11-05 MED ORDER — PREDNISONE 5 MG PO TABS
ORAL_TABLET | ORAL | 0 refills | Status: AC
  Filled 2020-11-05: qty 48, 12d supply, fill #0

## 2020-11-06 ENCOUNTER — Ambulatory Visit: Payer: Medicare Other | Admitting: Podiatry

## 2020-11-10 ENCOUNTER — Ambulatory Visit: Payer: Medicare Other | Attending: Orthopedic Surgery | Admitting: Physical Therapy

## 2020-11-10 ENCOUNTER — Other Ambulatory Visit: Payer: Self-pay

## 2020-11-10 DIAGNOSIS — I890 Lymphedema, not elsewhere classified: Secondary | ICD-10-CM | POA: Diagnosis not present

## 2020-11-10 NOTE — Therapy (Signed)
[place], [place], [address] Phone: [phone]   Fax:  [phone]  Physical Therapy Evaluation  Patient Details  Name: [name] MRN: [id number] Date of Birth: 01-Dec-1950 Referring Provider (PT): Dr. Noemi Chapel   Encounter Date: 11/10/2020   PT End of Session - 11/10/20 1252     Visit Number 1    Number of Visits 9    Date for PT Re-Evaluation 12/11/20    PT Start Time 1100    PT Stop Time 1145    PT Time Calculation (min) 45 min    Activity Tolerance Patient tolerated treatment well    Behavior During Therapy [facility] for tasks assessed/performed             Past Medical History:  Diagnosis Date   Anxiety    Arthritis    back, neck, fingers   Cancer ([place])    prostate cancer -dx. bx.    Chronic constipation    Depression    Elevated PSA    Frequency of urination    Hearing loss    no aids used   Hemorrhoids    occ. flare ups mostly springtime.   History of gastroesophageal reflux (GERD)    History of ileus    POST OP 2008 SURGERY-- RESOLVED WITHOUT SURGICAL INTERVENTION   History of jaundice as a child    TEEN-- RESOLVED   Hyperlipidemia    Hypertension    Ileus, postoperative ([place])    after total knee in 2008   Neuromuscular disorder ([place])    improved numbness/tingling-remains slight tingling/numbness left index finger   Nocturia    OSA on CPAP    cpap settings 10   Pneumonia    Primary localized osteoarthritis of right knee 10/20/2020   Shortness of breath on exertion    Type 2 diabetes mellitus ([place])    Wears glasses     Past Surgical History:  Procedure Laterality Date   ANTERIOR CERVICAL DECOMP/DISCECTOMY FUSION  jan 2015   fusion   APPENDECTOMY  1963   COLONOSCOPY W/ POLYPECTOMY     JOINT REPLACEMENT     LTKA '08   LYMPHADENECTOMY Bilateral 08/11/2014   Procedure: PELVIC LYMPHADENECTOMY;  Surgeon: Raynelle Bring, MD;  Location: [facility];  Service: Urology;   Laterality: Bilateral;   PROSTATE BIOPSY N/A 07/01/2014   Procedure: BIOPSY TRANSRECTAL ULTRASONIC PROSTATE (TUBP);  Surgeon: Arvil Persons, MD;  Location: [facility];  Service: Urology;  Laterality: N/A;   ROBOT ASSISTED LAPAROSCOPIC RADICAL PROSTATECTOMY N/A 08/11/2014   Procedure: ROBOTIC ASSISTED LAPAROSCOPIC RADICAL PROSTATECTOMY LEVEL 2 UMBILICAL HERNIA REPAIR;  Surgeon: Raynelle Bring, MD;  Location: [facility];  Service: Urology;  Laterality: N/A;   TOTAL KNEE ARTHROPLASTY Left 2008   TOTAL KNEE ARTHROPLASTY Right 11/02/2020   Procedure: TOTAL KNEE ARTHROPLASTY;  Surgeon: Elsie Saas, MD;  Location: [facility];  Service: Orthopedics;  Laterality: Right;   TRANSRECTAL ULTRASOUND GUIDED PROSTATE BX  2012   VASECTOMY  2001  w/ gen. anes.    There were no vitals filed for this visit.    Subjective Assessment - 11/10/20 1123     Subjective Pt is having swelling in his right knee and medial thigh since total knee surgery.Pt comes in wearing bilateral thigh high compression stockings 20-30 . He is going for PT 2 times a week at [facility] for his post TKR rehab.  He uses a CPM and game ready at home for intermittent compression with cold  for his knee    Pertinent History Right TKA 11/02/2020 with swelling  right after Past history includes, left TKR 2008, varicose vein, 2016  prostatectomy with pelvic lymphadenectomy due to prostate cancer, hernia , diabetes, high blood pressure and high cholesterol  He wore compression socks on right leg before surgery and had some swelling above his knee prior to surgery. past history also includes anterior cervice  discectomy    Patient Stated Goals to get rid of this swelling    Currently in Pain? Yes    Pain Score 4     Pain Location Knee    Pain Orientation Right    Pain Descriptors / Indicators Aching;Nagging;Burning    Pain Type Surgical pain    Pain Frequency Intermittent    Aggravating Factors  stand up and walk    Pain Relieving Factors  better with rest                [facility] PT Assessment - 11/10/20 0001       Assessment   Medical Diagnosis right knee arthritis post total knee    Referring Provider (PT) Dr. Noemi Chapel    Onset Date/Surgical Date 11/02/20      Restrictions   Weight Bearing Restrictions No      Balance Screen   Has the patient fallen in the past 6 months No    Has the patient had a decrease in activity level because of a fear of falling?  Yes    Is the patient reluctant to leave their home because of a fear of falling?  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 , does not do other exercise regularly      Cognition   Overall Cognitive Status Within Functional Limits for tasks assessed      Observation/Other Assessments   Observations Pt comes in to PT walking with walker, he had bilateral 20-30 thigh high stockings on and post op dressing still intact over TKR incision that was not removed When stockings were removed, large venous varicosities visible on right leg calf to above knee and also visible on back of left leg to lesser degree      Observation/Other Assessments-Edema    Edema --   visible especially in right thigh     Sensation   Light Touch Not tested      Coordination   Gross Motor Movements are Fluid and Coordinated --   deferred     Posture/Postural Control   Posture/Postural Control Postural limitations    Postural Limitations Rounded Shoulders;Forward head      ROM / Strength   AROM / PROM / Strength --   deferred              LYMPHEDEMA/ONCOLOGY QUESTIONNAIRE - 11/10/20 0001       Type   Cancer Type prostate      Surgeries   Other Surgery Date --    prostatectomy 2018   Number Lymph Nodes Removed --   bilateral pelvic lymphedenectomy     Treatment   Active Chemotherapy Treatment No    Past Chemotherapy Treatment No    Active Radiation  Treatment No    Past Radiation Treatment No    Current Hormone Treatment No    Past Hormone Therapy No      What other symptoms do you have   Are you Having Heaviness or Tightness Yes    Are you having Pain Yes    Are you having pitting edema No    Is it Hard or Difficult finding clothes that fit No    Do you have infections No    Is there Decreased scar mobility --   not tested     Right Lower Extremity Lymphedema   30 cm Proximal to Suprapatella 59.5 cm   approximate at groin,, light buising above level of thigh   20 cm Proximal to Suprapatella 51 cm    10 cm Proximal to Suprapatella 46.5 cm    At Midpatella/Popliteal Crease 44.4 cm   over bandage   30 cm Proximal to Floor at Lateral Plantar Foot 39.5 cm    20 cm Proximal to Floor at Lateral Plantar Foot 31.2 1    10  cm Proximal to Floor at Lateral Malleoli 26 cm    5 cm Proximal to 1st MTP Joint 24.5 cm    Around Proximal Great Toe 9.3 cm      Left Lower Extremity Lymphedema   20 cm Proximal to Suprapatella 39 cm    10 cm Proximal to Suprapatella 41 cm    At Midpatella/Popliteal Crease 37 cm    30 cm Proximal to Floor at Lateral Plantar Foot 37 cm    20 cm Proximal to Floor at Lateral Plantar Foot 28 cm    10 cm Proximal to Floor at Lateral Malleoli 23.2 cm    5 cm Proximal to 1st MTP Joint 25 cm    Around Proximal Great Toe 9.6 cm                  Right leg   Right leg  Right leg   Back of left leg     Objective measurements completed on examination: See above findings.       [facility] Adult PT Treatment/Exercise - 11/10/20 0001       Self-Care   Self-Care Other Self-Care Comments    Other Self-Care Comments  pt encouraged to continue with PT and walking including CPM and game ready as that will also help with  lymph flow. Encouaged pt to gt a pair of compression "bike shorts" to provide compression to medial upper thight above compression stockings                    PT Education - 11/10/20 1854     Education Details to use bike shorts for thigh compression above stockings    Person(s) Educated Patient;Spouse    Methods Explanation    Comprehension Verbalized understanding                 PT Long Term Goals - 11/10/20 1902       PT LONG TERM GOAL #1   Title Pt and wife will report and demonstrate that they are able to manage lymphedema with manual lymph drainage and use of compression    Time 4    Period Weeks    Status New      PT LONG TERM GOAL #2   Title Pt will report understands how to assist with managment of lymphedema with elevation and exericse    Time 4    Period Weeks  Status New      PT LONG TERM GOAL #3   Title Pt will reduce the circumference of his right thigh at 20 cm proximal to superior kneecap to 45 cm    Baseline 51    Time 4    Period Weeks    Status New                    Plan - 11/10/20 1855     Clinical Impression Statement Pt comes to lymphedema specialty clinic to manage lypmhedema in right leg after total knee replacement one week ago. Pt has had pelvic lymph nodes removed from prostate cancer surgery and venous varicosities/inusfficiency contrubuting to the overload of the lymphatic system post op.He and his wife will benefit from learning self manual lymph drainage and compression bandaging from this in conjunction with orthopedic PT.    Personal Factors and Comorbidities Comorbidity 3+    Comorbidities history of prostate cancer with lymphadenectomy, venous varicosity, hernia, diabetes    Examination-Activity Limitations Transfers;Stand;Stairs;Squat    Examination-Participation Restrictions Driving;Community Activity;Occupation    Stability/Clinical Decision Making Stable/Uncomplicated    Clinical Decision Making Low     PT Frequency 2x / week    PT Duration 4 weeks   pt will not need all visits once he and wife learn technique   PT Treatment/Interventions Taping;Manual lymph drainage;Compression bandaging;Therapeutic exercise;Patient/family education;Orthotic Fit/Training    PT Next Visit Plan begin and teach MLD, compression bandaging, remedial exercise for edema reduction, consider kinesiotaping    Consulted and Agree with Plan of Care Patient             Patient will benefit from skilled therapeutic intervention in order to improve the following deficits and impairments:  Increased edema, Decreased knowledge of precautions, Decreased knowledge of use of DME  Visit Diagnosis: Lymphedema, not elsewhere classified - Plan: PT plan of care cert/re-cert     Problem List Patient Active Problem List   Diagnosis Date Noted   Mild recurrent major depression ([place]) 11/02/2020   Allergic rhinitis 10/20/2020   Anxiety 10/20/2020   Benign prostatic hyperplasia 10/20/2020   Constipation 10/20/2020   Diabetes mellitus without complication ([place]) 79/15/0569   Diabetic peripheral neuropathy associated with type 2 diabetes mellitus ([place]) 10/20/2020   Elevated PSA 10/20/2020   Gastroesophageal reflux disease 10/20/2020   History of malignant neoplasm of prostate 10/20/2020   Hypertension 10/20/2020   Major depression in remission ([place]) 10/20/2020   Leukocytosis 10/20/2020   Mixed hyperlipidemia 10/20/2020   Obesity 10/20/2020   Sleep apnea 10/20/2020   Personal history of colonic polyps 10/20/2020   Pure hypercholesterolemia 10/20/2020   Primary localized osteoarthritis of right knee 10/20/2020   Prostate cancer ([place]) 08/11/2014   Donato Heinz. Owens Shark PT  Norwood Levo 11/10/2020, 7:08 PM  [place], [place], [address] Phone: [phone]   Fax:  [phone]  Name: MYKAL BATIZ MRN: [id number] Date of Birth:  13-Jul-1950

## 2020-11-11 ENCOUNTER — Other Ambulatory Visit: Payer: Self-pay

## 2020-11-11 ENCOUNTER — Other Ambulatory Visit (HOSPITAL_BASED_OUTPATIENT_CLINIC_OR_DEPARTMENT_OTHER): Payer: Self-pay

## 2020-11-11 MED FILL — Glucose Blood Test Strip: 50 days supply | Qty: 50 | Fill #1 | Status: AC

## 2020-11-12 ENCOUNTER — Other Ambulatory Visit: Payer: Self-pay

## 2020-11-12 ENCOUNTER — Ambulatory Visit: Payer: Medicare Other | Admitting: Physical Therapy

## 2020-11-12 DIAGNOSIS — I890 Lymphedema, not elsewhere classified: Secondary | ICD-10-CM

## 2020-11-12 NOTE — Therapy (Signed)
[place], [place], [address] Phone: [phone]   Fax:  [phone]  Physical Therapy Treatment  Patient Details  Name: [name] MRN: [id number] Date of Birth: [birth year] Referring Provider (PT): Dr. Noemi Chapel   Encounter Date: 11/12/2020   PT End of Session - 11/12/20 1707     Visit Number 2    Number of Visits 9    Date for PT Re-Evaluation 12/11/20    PT Start Time 1500    PT Stop Time 1555    PT Time Calculation (min) 55 min    Activity Tolerance Patient tolerated treatment well    Behavior During Therapy [facility] for tasks assessed/performed             Past Medical History:  Diagnosis Date   Anxiety    Arthritis    back, neck, fingers   Cancer ([place])    prostate cancer -dx. bx.    Chronic constipation    Depression    Elevated PSA    Frequency of urination    Hearing loss    no aids used   Hemorrhoids    occ. flare ups mostly springtime.   History of gastroesophageal reflux (GERD)    History of ileus    POST OP 2008 SURGERY-- RESOLVED WITHOUT SURGICAL INTERVENTION   History of jaundice as a child    TEEN-- RESOLVED   Hyperlipidemia    Hypertension    Ileus, postoperative ([place])    after total knee in 2008   Neuromuscular disorder ([place])    improved numbness/tingling-remains slight tingling/numbness left index finger   Nocturia    OSA on CPAP    cpap settings 10   Pneumonia    Primary localized osteoarthritis of right knee 10/20/2020   Shortness of breath on exertion    Type 2 diabetes mellitus ([place])    Wears glasses     Past Surgical History:  Procedure Laterality Date   ANTERIOR CERVICAL DECOMP/DISCECTOMY FUSION  jan 2015   fusion   APPENDECTOMY  1963   COLONOSCOPY W/ POLYPECTOMY     JOINT REPLACEMENT     LTKA '08   LYMPHADENECTOMY Bilateral 08/11/2014   Procedure: PELVIC LYMPHADENECTOMY;  Surgeon: Raynelle Bring, MD;  Location: [facility];  Service: Urology;   Laterality: Bilateral;   PROSTATE BIOPSY N/A 07/01/2014   Procedure: BIOPSY TRANSRECTAL ULTRASONIC PROSTATE (TUBP);  Surgeon: Arvil Persons, MD;  Location: [facility];  Service: Urology;  Laterality: N/A;   ROBOT ASSISTED LAPAROSCOPIC RADICAL PROSTATECTOMY N/A 08/11/2014   Procedure: ROBOTIC ASSISTED LAPAROSCOPIC RADICAL PROSTATECTOMY LEVEL 2 UMBILICAL HERNIA REPAIR;  Surgeon: Raynelle Bring, MD;  Location[facility];  Service: Urology;  Laterality: N/A;   TOTAL KNEE ARTHROPLASTY Left 2008   TOTAL KNEE ARTHROPLASTY Right 11/02/2020   Procedure: TOTAL KNEE ARTHROPLASTY;  Surgeon: Elsie Saas, MD;  Location: [facility];  Service: Orthopedics;  Laterality: Right;   TRANSRECTAL ULTRASOUND GUIDED PROSTATE BX  2012   VASECTOMY  2001  w/ gen. anes.    There were no vitals filed for this visit.   Subjective Assessment - 11/12/20 1513     Subjective Pt saw the doctor and he feels that his recovery is progressing well.  He got the dressing removed from his knee.  He has a pair of light compression bike shorts and feels he is doing better    Pertinent History Right TKA 11/02/2020 with swelling  right after Past history includes, left TKR 2008, varicose  vein, 2016  prostatectomy with pelvic lymphadenectomy due to prostate cancer, hernia , diabetes, high blood pressure and high cholesterol  He wore compression socks on right leg before surgery and had some swelling above his knee prior to surgery. past history also includes anterior cervice  discectomy    Patient Stated Goals to get rid of this swelling    Currently in Pain? Yes    Pain Score 4     Pain Location Knee    Pain Orientation Right    Pain Descriptors / Indicators Throbbing;Dull                [facility] PT Assessment - 11/12/20 0001       Assessment   Medical Diagnosis right knee arthritis post total knee lymphedema    Referring Provider (PT) Dr. Noemi Chapel    Onset Date/Surgical Date 11/02/20                            [facility] Adult PT Treatment/Exercise - 11/12/20 0001       Manual Therapy   Manual Therapy Edema management;Manual Lymphatic Drainage (MLD);Compression Bandaging    Manual therapy comments pt has light compression bike shorts over compression thigh highs and feels he is already getting better    Edema Management provided weblinks to klosetraining videos on self MLD and bandaging.    Manual Lymphatic Drainage (MLD) in supine with head of bed elevated and right leg up on 2 pillows. short neck. diaphragmatic breathing, right axillary nodes, right inguino-axillo anastamosis, right lateral hip, medical to lateral thigh, medial and lateral knee, lower leg to ffot and return along pathways.  Instructed wife with hand over hand technique and she was able to return demonstrate.    Compression Bandaging medial tg soft on lower leg and large tg soft on upper leg, small and large foam wrap, 1-8, 1- 10 and 2 -12 short stretch bandages with wife attentively watching.                         PT Long Term Goals - 11/10/20 1902       PT LONG TERM GOAL #1   Title Pt and wife will report and demonstrate that they are able to manage lymphedema with manual lymph drainage and use of compression    Time 4    Period Weeks    Status New      PT LONG TERM GOAL #2   Title Pt will report understands how to assist with managment of lymphedema with elevation and exericse    Time 4    Period Weeks    Status New      PT LONG TERM GOAL #3   Title Pt will reduce the circumference of his right thigh at 20 cm proximal to superior kneecap to 45 cm    Baseline 51    Time 4    Period Weeks    Status New                   Plan - 11/12/20 1707     Clinical Impression Statement Initial session of MLD and compression bandaging tolerated well.  Wife taught MLD technique and she was attentive to bandaging pt instructed to bring all bandages back. Pt will wear compression  stockings and thigh highs during the day to make sure he can keep his knee moving and will wear the compression bandaging at  night for now, but knows he may need to alter this schedule if his swelling does not improve    Personal Factors and Comorbidities Comorbidity 3+    Comorbidities history of prostate cancer with lymphadenectomy, venous varicosity, hernia, diabetes    Examination-Activity Limitations Transfers;Stand;Stairs;Squat    Examination-Participation Restrictions Driving;Community Activity;Occupation    Stability/Clinical Decision Making Stable/Uncomplicated    PT Frequency 2x / week    PT Duration 4 weeks    PT Treatment/Interventions Taping;Manual lymph drainage;Compression bandaging;Therapeutic exercise;Patient/family education;Orthotic Fit/Training    PT Next Visit Plan continue teaching  MLD, compression bandaging, remedial exercise for edema reduction, consider kinesiotaping remeasure once a week.    Consulted and Agree with Plan of Care Patient             Patient will benefit from skilled therapeutic intervention in order to improve the following deficits and impairments:  Increased edema, Decreased knowledge of precautions, Decreased knowledge of use of DME  Visit Diagnosis: Lymphedema, not elsewhere classified     Problem List Patient Active Problem List   Diagnosis Date Noted   Mild recurrent major depression ([place]) 11/02/2020   Allergic rhinitis 10/20/2020   Anxiety 10/20/2020   Benign prostatic hyperplasia 10/20/2020   Constipation 10/20/2020   Diabetes mellitus without complication ([place]) 02/12/1218   Diabetic peripheral neuropathy associated with type 2 diabetes mellitus ([place]) 10/20/2020   Elevated PSA 10/20/2020   Gastroesophageal reflux disease 10/20/2020   History of malignant neoplasm of prostate 10/20/2020   Hypertension 10/20/2020   Major depression in remission ([place]) 10/20/2020   Leukocytosis 10/20/2020   Mixed hyperlipidemia 10/20/2020    Obesity 10/20/2020   Sleep apnea 10/20/2020   Personal history of colonic polyps 10/20/2020   Pure hypercholesterolemia 10/20/2020   Primary localized osteoarthritis of right knee 10/20/2020   Prostate cancer ([place]) 08/11/2014   Donato Heinz. Owens Shark PT  [place] Levo 11/12/2020, 5:12 PM  [place], [place], [address] Phone: [phone]   Fax:  [phone]  Name: [name] MRN: [id number] Date of Birth: 07/26/1950

## 2020-11-12 NOTE — Patient Instructions (Signed)
[email or web address] Resources tab on the far right, Self care videos Self MLD to lower extremity Lower extremity bandaging

## 2020-11-13 ENCOUNTER — Other Ambulatory Visit (HOSPITAL_BASED_OUTPATIENT_CLINIC_OR_DEPARTMENT_OTHER): Payer: Self-pay

## 2020-11-13 MED ORDER — SIMVASTATIN 40 MG PO TABS
ORAL_TABLET | ORAL | 5 refills | Status: DC
  Filled 2020-11-13: qty 31, 31d supply, fill #0
  Filled 2020-12-11: qty 31, 31d supply, fill #1
  Filled 2021-01-16: qty 31, 31d supply, fill #2
  Filled 2021-02-19: qty 31, 31d supply, fill #3
  Filled 2021-03-22: qty 31, 31d supply, fill #4
  Filled 2021-04-21: qty 31, 31d supply, fill #5

## 2020-11-16 MED FILL — Ramipril Cap 10 MG: ORAL | 90 days supply | Qty: 90 | Fill #1 | Status: AC

## 2020-11-17 ENCOUNTER — Other Ambulatory Visit: Payer: Self-pay

## 2020-11-17 ENCOUNTER — Other Ambulatory Visit (HOSPITAL_BASED_OUTPATIENT_CLINIC_OR_DEPARTMENT_OTHER): Payer: Self-pay

## 2020-11-17 ENCOUNTER — Ambulatory Visit: Payer: Medicare Other

## 2020-11-17 MED ORDER — METFORMIN HCL 500 MG PO TABS
ORAL_TABLET | ORAL | 3 refills | Status: DC
  Filled 2020-11-17: qty 360, 90d supply, fill #0
  Filled 2021-02-14: qty 360, 90d supply, fill #1
  Filled 2021-05-15: qty 360, 90d supply, fill #2
  Filled 2021-08-16: qty 360, 90d supply, fill #3

## 2020-11-18 ENCOUNTER — Other Ambulatory Visit: Payer: Self-pay

## 2020-11-19 ENCOUNTER — Other Ambulatory Visit (HOSPITAL_BASED_OUTPATIENT_CLINIC_OR_DEPARTMENT_OTHER): Payer: Self-pay

## 2020-12-01 ENCOUNTER — Other Ambulatory Visit (HOSPITAL_BASED_OUTPATIENT_CLINIC_OR_DEPARTMENT_OTHER): Payer: Self-pay

## 2020-12-01 MED ORDER — CELECOXIB 200 MG PO CAPS
ORAL_CAPSULE | ORAL | 3 refills | Status: AC
  Filled 2020-12-01: qty 30, 30d supply, fill #0

## 2020-12-01 MED FILL — Amlodipine Besylate Tab 5 MG (Base Equivalent): ORAL | 90 days supply | Qty: 90 | Fill #1 | Status: AC

## 2020-12-02 ENCOUNTER — Other Ambulatory Visit (HOSPITAL_BASED_OUTPATIENT_CLINIC_OR_DEPARTMENT_OTHER): Payer: Self-pay

## 2020-12-07 ENCOUNTER — Other Ambulatory Visit (HOSPITAL_BASED_OUTPATIENT_CLINIC_OR_DEPARTMENT_OTHER): Payer: Self-pay

## 2020-12-07 MED ORDER — FLUTICASONE PROPIONATE 50 MCG/ACT NA SUSP
NASAL | 0 refills | Status: AC
  Filled 2020-12-07: qty 16, 30d supply, fill #0

## 2020-12-11 ENCOUNTER — Other Ambulatory Visit (HOSPITAL_BASED_OUTPATIENT_CLINIC_OR_DEPARTMENT_OTHER): Payer: Self-pay

## 2021-01-07 ENCOUNTER — Other Ambulatory Visit (HOSPITAL_BASED_OUTPATIENT_CLINIC_OR_DEPARTMENT_OTHER): Payer: Self-pay

## 2021-01-07 MED FILL — Glucose Blood Test Strip: 50 days supply | Qty: 50 | Fill #2 | Status: AC

## 2021-01-18 ENCOUNTER — Other Ambulatory Visit (HOSPITAL_BASED_OUTPATIENT_CLINIC_OR_DEPARTMENT_OTHER): Payer: Self-pay

## 2021-01-21 ENCOUNTER — Other Ambulatory Visit (HOSPITAL_BASED_OUTPATIENT_CLINIC_OR_DEPARTMENT_OTHER): Payer: Self-pay

## 2021-01-22 ENCOUNTER — Other Ambulatory Visit (HOSPITAL_BASED_OUTPATIENT_CLINIC_OR_DEPARTMENT_OTHER): Payer: Self-pay

## 2021-01-25 ENCOUNTER — Other Ambulatory Visit (HOSPITAL_BASED_OUTPATIENT_CLINIC_OR_DEPARTMENT_OTHER): Payer: Self-pay

## 2021-01-25 MED ORDER — ONETOUCH DELICA PLUS LANCET33G MISC
11 refills | Status: DC
  Filled 2021-01-25: qty 100, 90d supply, fill #0

## 2021-02-01 ENCOUNTER — Other Ambulatory Visit (HOSPITAL_BASED_OUTPATIENT_CLINIC_OR_DEPARTMENT_OTHER): Payer: Self-pay

## 2021-02-15 ENCOUNTER — Ambulatory Visit: Payer: Medicare Other | Attending: Internal Medicine

## 2021-02-15 ENCOUNTER — Other Ambulatory Visit (HOSPITAL_BASED_OUTPATIENT_CLINIC_OR_DEPARTMENT_OTHER): Payer: Self-pay

## 2021-02-15 DIAGNOSIS — Z23 Encounter for immunization: Secondary | ICD-10-CM

## 2021-02-15 MED ORDER — INFLUENZA VAC A&B SA ADJ QUAD 0.5 ML IM PRSY
PREFILLED_SYRINGE | INTRAMUSCULAR | 0 refills | Status: AC
  Filled 2021-02-15: qty 0.5, 1d supply, fill #0

## 2021-02-15 NOTE — Progress Notes (Signed)
   Covid-19 Vaccination Clinic  Name:  MCKENZIE TORUNO    MRN: [id number] DOB: [birth year]  02/15/2021  [name] was observed post Covid-19 immunization for 15 minutes without incident. He was provided with Vaccine Information Sheet and instruction to access the V-Safe system.   Mr. Marcott was instructed to call 911 with any severe reactions post vaccine: Difficulty breathing  Swelling of face and throat  A fast heartbeat  A bad rash all over body  Dizziness and weakness

## 2021-02-17 ENCOUNTER — Other Ambulatory Visit (HOSPITAL_BASED_OUTPATIENT_CLINIC_OR_DEPARTMENT_OTHER): Payer: Self-pay

## 2021-02-18 ENCOUNTER — Other Ambulatory Visit (HOSPITAL_BASED_OUTPATIENT_CLINIC_OR_DEPARTMENT_OTHER): Payer: Self-pay

## 2021-02-19 ENCOUNTER — Other Ambulatory Visit (HOSPITAL_BASED_OUTPATIENT_CLINIC_OR_DEPARTMENT_OTHER): Payer: Self-pay

## 2021-02-19 ENCOUNTER — Other Ambulatory Visit: Payer: Self-pay

## 2021-02-19 MED ORDER — RAMIPRIL 10 MG PO CAPS
10.0000 mg | ORAL_CAPSULE | Freq: Every day | ORAL | 3 refills | Status: AC
  Filled 2021-02-19: qty 90, 90d supply, fill #0

## 2021-02-19 MED ORDER — RAMIPRIL 10 MG PO CAPS
ORAL_CAPSULE | ORAL | 3 refills | Status: DC
  Filled 2021-02-19: qty 90, 90d supply, fill #0
  Filled 2021-05-24: qty 90, 90d supply, fill #1
  Filled 2021-08-23: qty 90, 90d supply, fill #2
  Filled 2021-11-20: qty 90, 90d supply, fill #3

## 2021-02-22 ENCOUNTER — Other Ambulatory Visit (HOSPITAL_BASED_OUTPATIENT_CLINIC_OR_DEPARTMENT_OTHER): Payer: Self-pay

## 2021-02-23 ENCOUNTER — Other Ambulatory Visit (HOSPITAL_BASED_OUTPATIENT_CLINIC_OR_DEPARTMENT_OTHER): Payer: Self-pay

## 2021-02-23 MED ORDER — COVID-19MRNA BIVAL VACC PFIZER 30 MCG/0.3ML IM SUSP
INTRAMUSCULAR | 0 refills | Status: AC
  Filled 2021-02-23: qty 0.3, 1d supply, fill #0

## 2021-03-05 ENCOUNTER — Other Ambulatory Visit: Payer: Self-pay

## 2021-03-09 ENCOUNTER — Other Ambulatory Visit (HOSPITAL_BASED_OUTPATIENT_CLINIC_OR_DEPARTMENT_OTHER): Payer: Self-pay

## 2021-03-09 MED ORDER — AMLODIPINE BESYLATE 5 MG PO TABS
ORAL_TABLET | ORAL | 0 refills | Status: DC
  Filled 2021-03-09: qty 90, 90d supply, fill #0

## 2021-03-10 ENCOUNTER — Other Ambulatory Visit (HOSPITAL_BASED_OUTPATIENT_CLINIC_OR_DEPARTMENT_OTHER): Payer: Self-pay

## 2021-03-14 MED FILL — Glucose Blood Test Strip: 50 days supply | Qty: 50 | Fill #3 | Status: AC

## 2021-03-15 ENCOUNTER — Other Ambulatory Visit (HOSPITAL_BASED_OUTPATIENT_CLINIC_OR_DEPARTMENT_OTHER): Payer: Self-pay

## 2021-03-22 ENCOUNTER — Other Ambulatory Visit (HOSPITAL_BASED_OUTPATIENT_CLINIC_OR_DEPARTMENT_OTHER): Payer: Self-pay

## 2021-04-21 ENCOUNTER — Other Ambulatory Visit: Payer: Self-pay

## 2021-04-21 ENCOUNTER — Other Ambulatory Visit (HOSPITAL_BASED_OUTPATIENT_CLINIC_OR_DEPARTMENT_OTHER): Payer: Self-pay

## 2021-04-22 ENCOUNTER — Other Ambulatory Visit (HOSPITAL_BASED_OUTPATIENT_CLINIC_OR_DEPARTMENT_OTHER): Payer: Self-pay

## 2021-04-22 MED ORDER — ONETOUCH VERIO VI STRP
ORAL_STRIP | 4 refills | Status: DC
  Filled 2021-04-22: qty 50, 50d supply, fill #0
  Filled 2021-06-24: qty 50, 50d supply, fill #1
  Filled 2021-09-20: qty 50, 50d supply, fill #2
  Filled 2022-01-30: qty 50, 50d supply, fill #3

## 2021-04-28 ENCOUNTER — Other Ambulatory Visit (HOSPITAL_BASED_OUTPATIENT_CLINIC_OR_DEPARTMENT_OTHER): Payer: Self-pay

## 2021-05-07 ENCOUNTER — Other Ambulatory Visit (HOSPITAL_BASED_OUTPATIENT_CLINIC_OR_DEPARTMENT_OTHER): Payer: Self-pay

## 2021-05-07 MED ORDER — BENZONATATE 200 MG PO CAPS
ORAL_CAPSULE | ORAL | 0 refills | Status: AC
  Filled 2021-05-07: qty 21, 7d supply, fill #0

## 2021-05-17 ENCOUNTER — Other Ambulatory Visit (HOSPITAL_BASED_OUTPATIENT_CLINIC_OR_DEPARTMENT_OTHER): Payer: Self-pay

## 2021-05-18 ENCOUNTER — Other Ambulatory Visit (HOSPITAL_BASED_OUTPATIENT_CLINIC_OR_DEPARTMENT_OTHER): Payer: Self-pay

## 2021-05-24 ENCOUNTER — Other Ambulatory Visit (HOSPITAL_BASED_OUTPATIENT_CLINIC_OR_DEPARTMENT_OTHER): Payer: Self-pay

## 2021-05-25 ENCOUNTER — Other Ambulatory Visit (HOSPITAL_BASED_OUTPATIENT_CLINIC_OR_DEPARTMENT_OTHER): Payer: Self-pay

## 2021-05-25 MED ORDER — SIMVASTATIN 40 MG PO TABS
40.0000 mg | ORAL_TABLET | Freq: Every day | ORAL | 5 refills | Status: AC
  Filled 2021-05-25: qty 31, 31d supply, fill #0

## 2021-05-26 ENCOUNTER — Other Ambulatory Visit (HOSPITAL_BASED_OUTPATIENT_CLINIC_OR_DEPARTMENT_OTHER): Payer: Self-pay

## 2021-05-26 MED ORDER — AMLODIPINE BESYLATE 5 MG PO TABS
5.0000 mg | ORAL_TABLET | Freq: Two times a day (BID) | ORAL | 4 refills | Status: DC
  Filled 2021-05-26: qty 180, 90d supply, fill #0
  Filled 2021-08-23: qty 180, 90d supply, fill #1
  Filled 2021-11-20: qty 180, 90d supply, fill #2
  Filled 2022-02-23: qty 180, 90d supply, fill #3
  Filled 2022-05-26: qty 180, 90d supply, fill #4

## 2021-06-01 ENCOUNTER — Other Ambulatory Visit (HOSPITAL_BASED_OUTPATIENT_CLINIC_OR_DEPARTMENT_OTHER): Payer: Self-pay

## 2021-06-07 ENCOUNTER — Other Ambulatory Visit (HOSPITAL_COMMUNITY): Payer: Self-pay

## 2021-06-17 ENCOUNTER — Other Ambulatory Visit (HOSPITAL_BASED_OUTPATIENT_CLINIC_OR_DEPARTMENT_OTHER): Payer: Self-pay

## 2021-06-21 ENCOUNTER — Other Ambulatory Visit (HOSPITAL_BASED_OUTPATIENT_CLINIC_OR_DEPARTMENT_OTHER): Payer: Self-pay

## 2021-06-21 ENCOUNTER — Other Ambulatory Visit (HOSPITAL_COMMUNITY): Payer: Self-pay

## 2021-06-21 MED ORDER — SIMVASTATIN 40 MG PO TABS
ORAL_TABLET | ORAL | 0 refills | Status: DC
  Filled 2021-06-21: qty 90, 90d supply, fill #0

## 2021-06-22 ENCOUNTER — Other Ambulatory Visit (HOSPITAL_COMMUNITY): Payer: Self-pay

## 2021-06-24 ENCOUNTER — Other Ambulatory Visit (HOSPITAL_BASED_OUTPATIENT_CLINIC_OR_DEPARTMENT_OTHER): Payer: Self-pay

## 2021-08-02 ENCOUNTER — Other Ambulatory Visit (HOSPITAL_BASED_OUTPATIENT_CLINIC_OR_DEPARTMENT_OTHER): Payer: Self-pay

## 2021-08-03 ENCOUNTER — Other Ambulatory Visit (HOSPITAL_BASED_OUTPATIENT_CLINIC_OR_DEPARTMENT_OTHER): Payer: Self-pay

## 2021-08-16 ENCOUNTER — Other Ambulatory Visit (HOSPITAL_BASED_OUTPATIENT_CLINIC_OR_DEPARTMENT_OTHER): Payer: Self-pay

## 2021-08-18 ENCOUNTER — Other Ambulatory Visit (HOSPITAL_BASED_OUTPATIENT_CLINIC_OR_DEPARTMENT_OTHER): Payer: Self-pay

## 2021-08-18 MED ORDER — BISACODYL 5 MG PO TBEC
DELAYED_RELEASE_TABLET | ORAL | 0 refills | Status: AC
  Filled 2021-08-18: qty 25, 25d supply, fill #0

## 2021-08-18 MED ORDER — PEG 3350-KCL-NA BICARB-NACL 420 G PO SOLR
ORAL | 0 refills | Status: AC
  Filled 2021-08-18: qty 4000, 1d supply, fill #0

## 2021-08-19 ENCOUNTER — Other Ambulatory Visit (HOSPITAL_BASED_OUTPATIENT_CLINIC_OR_DEPARTMENT_OTHER): Payer: Self-pay

## 2021-08-23 ENCOUNTER — Other Ambulatory Visit (HOSPITAL_BASED_OUTPATIENT_CLINIC_OR_DEPARTMENT_OTHER): Payer: Self-pay

## 2021-08-31 ENCOUNTER — Other Ambulatory Visit (HOSPITAL_BASED_OUTPATIENT_CLINIC_OR_DEPARTMENT_OTHER): Payer: Self-pay

## 2021-08-31 MED ORDER — MUPIROCIN 2 % EX OINT
TOPICAL_OINTMENT | CUTANEOUS | 11 refills | Status: AC
  Filled 2021-08-31: qty 22, 10d supply, fill #0

## 2021-09-19 ENCOUNTER — Other Ambulatory Visit (HOSPITAL_BASED_OUTPATIENT_CLINIC_OR_DEPARTMENT_OTHER): Payer: Self-pay

## 2021-09-20 ENCOUNTER — Other Ambulatory Visit (HOSPITAL_BASED_OUTPATIENT_CLINIC_OR_DEPARTMENT_OTHER): Payer: Self-pay

## 2021-09-20 MED ORDER — SIMVASTATIN 40 MG PO TABS
40.0000 mg | ORAL_TABLET | Freq: Every day | ORAL | 0 refills | Status: DC
  Filled 2021-09-20: qty 90, 90d supply, fill #0

## 2021-10-29 ENCOUNTER — Other Ambulatory Visit (HOSPITAL_BASED_OUTPATIENT_CLINIC_OR_DEPARTMENT_OTHER): Payer: Self-pay

## 2021-11-13 ENCOUNTER — Other Ambulatory Visit (HOSPITAL_BASED_OUTPATIENT_CLINIC_OR_DEPARTMENT_OTHER): Payer: Self-pay

## 2021-11-15 ENCOUNTER — Other Ambulatory Visit (HOSPITAL_BASED_OUTPATIENT_CLINIC_OR_DEPARTMENT_OTHER): Payer: Self-pay

## 2021-11-15 MED ORDER — METFORMIN HCL 500 MG PO TABS
ORAL_TABLET | ORAL | 3 refills | Status: DC
  Filled 2021-11-15: qty 360, 90d supply, fill #0
  Filled 2022-02-15: qty 360, 90d supply, fill #1
  Filled 2022-05-16: qty 360, 90d supply, fill #2
  Filled 2022-08-15: qty 360, 90d supply, fill #3

## 2021-11-16 ENCOUNTER — Other Ambulatory Visit (HOSPITAL_BASED_OUTPATIENT_CLINIC_OR_DEPARTMENT_OTHER): Payer: Self-pay

## 2021-11-22 ENCOUNTER — Other Ambulatory Visit (HOSPITAL_BASED_OUTPATIENT_CLINIC_OR_DEPARTMENT_OTHER): Payer: Self-pay

## 2021-12-24 ENCOUNTER — Other Ambulatory Visit (HOSPITAL_BASED_OUTPATIENT_CLINIC_OR_DEPARTMENT_OTHER): Payer: Self-pay

## 2021-12-24 MED ORDER — SIMVASTATIN 40 MG PO TABS
40.0000 mg | ORAL_TABLET | Freq: Every day | ORAL | 3 refills | Status: DC
  Filled 2021-12-24: qty 90, 90d supply, fill #0
  Filled 2022-03-27: qty 90, 90d supply, fill #1
  Filled 2022-06-21: qty 90, 90d supply, fill #2
  Filled 2022-09-27: qty 90, 90d supply, fill #3

## 2022-01-29 ENCOUNTER — Other Ambulatory Visit (HOSPITAL_BASED_OUTPATIENT_CLINIC_OR_DEPARTMENT_OTHER): Payer: Self-pay

## 2022-01-31 ENCOUNTER — Other Ambulatory Visit (HOSPITAL_BASED_OUTPATIENT_CLINIC_OR_DEPARTMENT_OTHER): Payer: Self-pay

## 2022-01-31 MED ORDER — FLUAD QUADRIVALENT 0.5 ML IM PRSY
PREFILLED_SYRINGE | INTRAMUSCULAR | 0 refills | Status: AC
  Filled 2022-01-31: qty 0.5, 1d supply, fill #0

## 2022-02-01 ENCOUNTER — Other Ambulatory Visit (HOSPITAL_BASED_OUTPATIENT_CLINIC_OR_DEPARTMENT_OTHER): Payer: Self-pay

## 2022-02-07 ENCOUNTER — Other Ambulatory Visit (HOSPITAL_BASED_OUTPATIENT_CLINIC_OR_DEPARTMENT_OTHER): Payer: Self-pay

## 2022-02-07 MED ORDER — FLUOXETINE HCL 40 MG PO CAPS: 40.0000 mg | ORAL_CAPSULE | Freq: Every day | ORAL | 4 refills | Status: AC

## 2022-02-07 MED ORDER — FLUOXETINE HCL 40 MG PO CAPS
40.0000 mg | ORAL_CAPSULE | Freq: Every day | ORAL | 3 refills | Status: DC
  Filled 2022-02-07: qty 90, 90d supply, fill #0
  Filled 2022-05-05: qty 90, 90d supply, fill #1
  Filled 2022-11-14: qty 90, 90d supply, fill #2

## 2022-02-15 ENCOUNTER — Other Ambulatory Visit (HOSPITAL_BASED_OUTPATIENT_CLINIC_OR_DEPARTMENT_OTHER): Payer: Self-pay

## 2022-02-15 MED ORDER — RAMIPRIL 10 MG PO CAPS
ORAL_CAPSULE | ORAL | 3 refills | Status: AC
  Filled 2022-02-15: qty 90, 90d supply, fill #0
  Filled 2022-05-16: qty 90, 90d supply, fill #1

## 2022-02-16 ENCOUNTER — Other Ambulatory Visit (HOSPITAL_BASED_OUTPATIENT_CLINIC_OR_DEPARTMENT_OTHER): Payer: Self-pay

## 2022-02-16 MED ORDER — RAMIPRIL 10 MG PO CAPS
10.0000 mg | ORAL_CAPSULE | Freq: Every day | ORAL | 3 refills | Status: AC
  Filled 2022-02-16: qty 90, 90d supply, fill #0

## 2022-02-23 ENCOUNTER — Other Ambulatory Visit (HOSPITAL_BASED_OUTPATIENT_CLINIC_OR_DEPARTMENT_OTHER): Payer: Self-pay

## 2022-03-28 ENCOUNTER — Other Ambulatory Visit (HOSPITAL_BASED_OUTPATIENT_CLINIC_OR_DEPARTMENT_OTHER): Payer: Self-pay

## 2022-04-13 ENCOUNTER — Other Ambulatory Visit (HOSPITAL_BASED_OUTPATIENT_CLINIC_OR_DEPARTMENT_OTHER): Payer: Self-pay

## 2022-04-15 ENCOUNTER — Other Ambulatory Visit (HOSPITAL_BASED_OUTPATIENT_CLINIC_OR_DEPARTMENT_OTHER): Payer: Self-pay

## 2022-04-15 MED ORDER — COMIRNATY 30 MCG/0.3ML IM SUSY
PREFILLED_SYRINGE | INTRAMUSCULAR | 0 refills | Status: AC
  Filled 2022-04-15: qty 0.3, 1d supply, fill #0

## 2022-05-16 ENCOUNTER — Other Ambulatory Visit (HOSPITAL_BASED_OUTPATIENT_CLINIC_OR_DEPARTMENT_OTHER): Payer: Self-pay

## 2022-05-17 ENCOUNTER — Other Ambulatory Visit (HOSPITAL_BASED_OUTPATIENT_CLINIC_OR_DEPARTMENT_OTHER): Payer: Self-pay

## 2022-05-17 MED ORDER — ONETOUCH VERIO VI STRP
ORAL_STRIP | 3 refills | Status: AC
  Filled 2022-05-17: qty 100, 90d supply, fill #0
  Filled 2022-12-30: qty 100, 90d supply, fill #1
  Filled 2023-04-07: qty 100, 90d supply, fill #2

## 2022-05-26 ENCOUNTER — Other Ambulatory Visit (HOSPITAL_BASED_OUTPATIENT_CLINIC_OR_DEPARTMENT_OTHER): Payer: Self-pay

## 2022-06-12 ENCOUNTER — Other Ambulatory Visit (HOSPITAL_BASED_OUTPATIENT_CLINIC_OR_DEPARTMENT_OTHER): Payer: Self-pay

## 2022-06-13 ENCOUNTER — Other Ambulatory Visit (HOSPITAL_BASED_OUTPATIENT_CLINIC_OR_DEPARTMENT_OTHER): Payer: Self-pay

## 2022-06-13 MED ORDER — FLUOXETINE HCL 20 MG PO CAPS
60.0000 mg | ORAL_CAPSULE | Freq: Every day | ORAL | 4 refills | Status: AC
  Filled 2022-06-13: qty 270, 90d supply, fill #0
  Filled 2022-11-14: qty 270, 90d supply, fill #1

## 2022-06-13 MED ORDER — ONETOUCH DELICA LANCETS 33G MISC
11 refills | Status: AC
  Filled 2022-06-13: qty 100, 90d supply, fill #0
  Filled 2023-04-07 – 2023-04-09 (×2): qty 100, 90d supply, fill #1

## 2022-06-21 ENCOUNTER — Other Ambulatory Visit (HOSPITAL_BASED_OUTPATIENT_CLINIC_OR_DEPARTMENT_OTHER): Payer: Self-pay

## 2022-07-05 ENCOUNTER — Other Ambulatory Visit (HOSPITAL_BASED_OUTPATIENT_CLINIC_OR_DEPARTMENT_OTHER): Payer: Self-pay

## 2022-07-05 MED ORDER — AZITHROMYCIN 250 MG PO TABS
ORAL_TABLET | ORAL | 0 refills | Status: DC
  Filled 2022-07-05: qty 6, 5d supply, fill #0

## 2022-07-22 ENCOUNTER — Other Ambulatory Visit (HOSPITAL_COMMUNITY): Payer: Self-pay

## 2022-08-12 ENCOUNTER — Other Ambulatory Visit (HOSPITAL_BASED_OUTPATIENT_CLINIC_OR_DEPARTMENT_OTHER): Payer: Self-pay

## 2022-08-12 MED ORDER — VALSARTAN 320 MG PO TABS
320.0000 mg | ORAL_TABLET | Freq: Every day | ORAL | 5 refills | Status: DC
  Filled 2022-08-12: qty 90, 90d supply, fill #0
  Filled 2022-11-14: qty 90, 90d supply, fill #1
  Filled 2023-02-10: qty 90, 90d supply, fill #2
  Filled 2023-06-13: qty 90, 90d supply, fill #3

## 2022-08-24 ENCOUNTER — Other Ambulatory Visit (HOSPITAL_BASED_OUTPATIENT_CLINIC_OR_DEPARTMENT_OTHER): Payer: Self-pay

## 2022-08-31 ENCOUNTER — Other Ambulatory Visit (HOSPITAL_BASED_OUTPATIENT_CLINIC_OR_DEPARTMENT_OTHER): Payer: Self-pay

## 2022-08-31 MED ORDER — AMLODIPINE BESYLATE 5 MG PO TABS
5.0000 mg | ORAL_TABLET | Freq: Two times a day (BID) | ORAL | 2 refills | Status: DC
  Filled 2022-08-31: qty 180, 90d supply, fill #0
  Filled 2022-12-03: qty 180, 90d supply, fill #1
  Filled 2023-03-13: qty 180, 90d supply, fill #2

## 2022-09-13 ENCOUNTER — Ambulatory Visit (INDEPENDENT_AMBULATORY_CARE_PROVIDER_SITE_OTHER): Payer: Medicare Other | Admitting: Podiatry

## 2022-09-13 DIAGNOSIS — E1142 Type 2 diabetes mellitus with diabetic polyneuropathy: Secondary | ICD-10-CM

## 2022-09-13 DIAGNOSIS — B351 Tinea unguium: Secondary | ICD-10-CM

## 2022-09-13 DIAGNOSIS — M79676 Pain in unspecified toe(s): Secondary | ICD-10-CM | POA: Diagnosis not present

## 2022-09-13 NOTE — Progress Notes (Signed)
[name] presents today on referral from his primary care provider chief concern of diabetic foot his current A1c is a 6.4.  He does have some early neuropathy states that his left side is his worst but he also relates back pain to the same side.  Objective: Vital signs are stable he is alert and oriented x 3 toenails are long thick yellow dystrophic onychomycotic sharply incurvated and tender on palpation particularly the hallux nails bilaterally.  Rigid hammertoe deformity with medial deviation second digit right foot with some plantarflexion of the second metatarsal mild reactive hyperkeratosis to the plantar aspect of the forefoot right over left.  Most likely secondary to hammertoe deformity.  Slightly diminished sensation per Semmes Weinstein monofilament.  Deep tendon reflexes are intact muscle strength is symmetrical bilateral.  No open lesions or wounds.  Assessment: Diabetes mellitus with diabetic peripheral neuropathy hammertoe deformity and painful onychomycosis.  Plan: Debridement of onychomycosis follow-up with me for routine debridement and questions or concerns.

## 2022-09-28 ENCOUNTER — Other Ambulatory Visit (HOSPITAL_BASED_OUTPATIENT_CLINIC_OR_DEPARTMENT_OTHER): Payer: Self-pay

## 2022-09-28 MED ORDER — MONTELUKAST SODIUM 10 MG PO TABS
10.0000 mg | ORAL_TABLET | Freq: Every day | ORAL | 3 refills | Status: AC
  Filled 2022-09-28: qty 90, 90d supply, fill #0

## 2022-09-28 MED ORDER — AZITHROMYCIN 250 MG PO TABS
ORAL_TABLET | ORAL | 0 refills | Status: AC
  Filled 2022-09-28: qty 6, 5d supply, fill #0

## 2022-09-28 MED ORDER — PREDNISONE 20 MG PO TABS
20.0000 mg | ORAL_TABLET | Freq: Two times a day (BID) | ORAL | 0 refills | Status: AC
  Filled 2022-09-28: qty 10, 5d supply, fill #0

## 2022-10-06 ENCOUNTER — Other Ambulatory Visit (HOSPITAL_BASED_OUTPATIENT_CLINIC_OR_DEPARTMENT_OTHER): Payer: Self-pay

## 2022-10-06 MED ORDER — BENZONATATE 100 MG PO CAPS
100.0000 mg | ORAL_CAPSULE | Freq: Three times a day (TID) | ORAL | 0 refills | Status: AC
  Filled 2022-10-06: qty 30, 10d supply, fill #0

## 2022-10-06 MED ORDER — DOXYCYCLINE HYCLATE 100 MG PO TABS
200.0000 mg | ORAL_TABLET | Freq: Once | ORAL | 0 refills | Status: AC
  Filled 2022-10-06: qty 2, 1d supply, fill #0

## 2022-10-06 MED ORDER — BENZONATATE 100 MG PO CAPS
100.0000 mg | ORAL_CAPSULE | Freq: Three times a day (TID) | ORAL | 0 refills | Status: AC | PRN
  Filled 2022-10-06: qty 30, 10d supply, fill #0

## 2022-10-06 MED ORDER — ALBUTEROL SULFATE HFA 108 (90 BASE) MCG/ACT IN AERS
2.0000 | INHALATION_SPRAY | RESPIRATORY_TRACT | 0 refills | Status: AC | PRN
  Filled 2022-10-06: qty 6.7, 25d supply, fill #0

## 2022-10-06 MED ORDER — ALBUTEROL SULFATE HFA 108 (90 BASE) MCG/ACT IN AERS
2.0000 | INHALATION_SPRAY | RESPIRATORY_TRACT | 0 refills | Status: AC | PRN
  Filled 2022-10-06: qty 6.7, 30d supply, fill #0

## 2022-10-10 ENCOUNTER — Other Ambulatory Visit (HOSPITAL_BASED_OUTPATIENT_CLINIC_OR_DEPARTMENT_OTHER): Payer: Self-pay

## 2022-10-10 MED ORDER — DOXYCYCLINE HYCLATE 100 MG PO TABS
100.0000 mg | ORAL_TABLET | Freq: Two times a day (BID) | ORAL | 0 refills | Status: AC
  Filled 2022-10-10: qty 14, 7d supply, fill #0

## 2022-10-10 MED ORDER — METHYLPREDNISOLONE 4 MG PO TBPK
ORAL_TABLET | ORAL | 0 refills | Status: AC
  Filled 2022-10-10: qty 21, 6d supply, fill #0

## 2022-11-14 ENCOUNTER — Other Ambulatory Visit (HOSPITAL_BASED_OUTPATIENT_CLINIC_OR_DEPARTMENT_OTHER): Payer: Self-pay

## 2022-11-14 MED ORDER — METFORMIN HCL 500 MG PO TABS
1000.0000 mg | ORAL_TABLET | Freq: Two times a day (BID) | ORAL | 3 refills | Status: DC
  Filled 2022-11-14: qty 360, 90d supply, fill #0
  Filled 2023-02-10: qty 360, 90d supply, fill #1
  Filled 2023-05-28: qty 360, 90d supply, fill #2
  Filled 2023-08-22: qty 360, 90d supply, fill #3

## 2022-12-08 ENCOUNTER — Other Ambulatory Visit (HOSPITAL_BASED_OUTPATIENT_CLINIC_OR_DEPARTMENT_OTHER): Payer: Self-pay

## 2022-12-08 MED ORDER — GABAPENTIN 100 MG PO CAPS
100.0000 mg | ORAL_CAPSULE | Freq: Every day | ORAL | 4 refills | Status: AC
  Filled 2022-12-08: qty 60, 30d supply, fill #0

## 2022-12-15 ENCOUNTER — Ambulatory Visit: Payer: Medicare Other | Admitting: Podiatry

## 2022-12-27 ENCOUNTER — Encounter: Payer: Self-pay | Admitting: Podiatry

## 2022-12-27 ENCOUNTER — Ambulatory Visit: Payer: Medicare Other | Admitting: Podiatry

## 2022-12-27 DIAGNOSIS — B351 Tinea unguium: Secondary | ICD-10-CM

## 2022-12-27 DIAGNOSIS — M79676 Pain in unspecified toe(s): Secondary | ICD-10-CM

## 2022-12-27 DIAGNOSIS — E1142 Type 2 diabetes mellitus with diabetic polyneuropathy: Secondary | ICD-10-CM

## 2022-12-27 NOTE — Progress Notes (Signed)
He presents today chief complaint of painful elongated toenails.  Objective: Pulses are palpable edema bilateral legs and feet.  Toenails are long thick yellow dystrophic clinically mycotic.  Assessment: Pain in limb secondary to onychomycosis.  Plan: Debridement of toenails 1 through 5 bilateral.

## 2022-12-31 ENCOUNTER — Other Ambulatory Visit (HOSPITAL_BASED_OUTPATIENT_CLINIC_OR_DEPARTMENT_OTHER): Payer: Self-pay

## 2023-01-02 ENCOUNTER — Other Ambulatory Visit (HOSPITAL_BASED_OUTPATIENT_CLINIC_OR_DEPARTMENT_OTHER): Payer: Self-pay

## 2023-01-02 MED ORDER — SIMVASTATIN 40 MG PO TABS
40.0000 mg | ORAL_TABLET | Freq: Every day | ORAL | 3 refills | Status: DC
  Filled 2023-01-02: qty 90, 90d supply, fill #0
  Filled 2023-04-07: qty 90, 90d supply, fill #1
  Filled 2023-07-05: qty 90, 90d supply, fill #2
  Filled 2023-09-22: qty 90, 90d supply, fill #3

## 2023-02-21 ENCOUNTER — Other Ambulatory Visit (HOSPITAL_BASED_OUTPATIENT_CLINIC_OR_DEPARTMENT_OTHER): Payer: Self-pay

## 2023-02-21 MED ORDER — FLUAD 0.5 ML IM SUSY
0.5000 mL | PREFILLED_SYRINGE | Freq: Once | INTRAMUSCULAR | 0 refills | Status: AC
  Filled 2023-02-21: qty 0.5, 1d supply, fill #0

## 2023-02-21 MED ORDER — COMIRNATY 30 MCG/0.3ML IM SUSY
0.3000 mL | PREFILLED_SYRINGE | Freq: Once | INTRAMUSCULAR | 0 refills | Status: AC
  Filled 2023-02-21: qty 0.3, 1d supply, fill #0

## 2023-03-13 ENCOUNTER — Other Ambulatory Visit (HOSPITAL_BASED_OUTPATIENT_CLINIC_OR_DEPARTMENT_OTHER): Payer: Self-pay

## 2023-03-13 MED ORDER — FLUOXETINE HCL 40 MG PO CAPS
40.0000 mg | ORAL_CAPSULE | Freq: Every day | ORAL | 3 refills | Status: AC
  Filled 2023-03-13: qty 90, 90d supply, fill #0
  Filled 2023-06-15: qty 90, 90d supply, fill #1
  Filled 2023-09-10: qty 90, 90d supply, fill #2
  Filled 2024-01-09 – 2024-03-05 (×2): qty 90, 90d supply, fill #3

## 2023-03-16 ENCOUNTER — Other Ambulatory Visit (HOSPITAL_BASED_OUTPATIENT_CLINIC_OR_DEPARTMENT_OTHER): Payer: Self-pay

## 2023-03-16 MED ORDER — RSVPREF3 VAC RECOMB ADJUVANTED 120 MCG/0.5ML IM SUSR
0.5000 mL | Freq: Once | INTRAMUSCULAR | 0 refills | Status: AC
  Filled 2023-03-16: qty 0.5, 1d supply, fill #0

## 2023-03-21 ENCOUNTER — Other Ambulatory Visit (HOSPITAL_BASED_OUTPATIENT_CLINIC_OR_DEPARTMENT_OTHER): Payer: Self-pay

## 2023-03-22 ENCOUNTER — Other Ambulatory Visit: Payer: Self-pay | Admitting: Internal Medicine

## 2023-03-22 ENCOUNTER — Other Ambulatory Visit (HOSPITAL_BASED_OUTPATIENT_CLINIC_OR_DEPARTMENT_OTHER): Payer: Self-pay

## 2023-03-22 DIAGNOSIS — R413 Other amnesia: Secondary | ICD-10-CM

## 2023-03-22 MED ORDER — BUSPIRONE HCL 5 MG PO TABS
5.0000 mg | ORAL_TABLET | Freq: Two times a day (BID) | ORAL | 5 refills | Status: DC
  Filled 2023-03-22: qty 60, 30d supply, fill #0
  Filled 2023-05-01: qty 60, 30d supply, fill #1
  Filled 2023-05-28: qty 60, 30d supply, fill #2
  Filled 2023-11-23 – 2023-12-08 (×4): qty 60, 30d supply, fill #3

## 2023-03-29 ENCOUNTER — Ambulatory Visit
Admission: RE | Admit: 2023-03-29 | Discharge: 2023-03-29 | Disposition: A | Discharge status: Home / Self Care | Payer: Medicare Other | Source: Ambulatory Visit | Attending: Internal Medicine | Admitting: Internal Medicine

## 2023-03-29 DIAGNOSIS — R413 Other amnesia: Secondary | ICD-10-CM

## 2023-04-09 ENCOUNTER — Other Ambulatory Visit (HOSPITAL_BASED_OUTPATIENT_CLINIC_OR_DEPARTMENT_OTHER): Payer: Self-pay

## 2023-05-01 ENCOUNTER — Other Ambulatory Visit (HOSPITAL_BASED_OUTPATIENT_CLINIC_OR_DEPARTMENT_OTHER): Payer: Self-pay

## 2023-06-26 ENCOUNTER — Other Ambulatory Visit (HOSPITAL_BASED_OUTPATIENT_CLINIC_OR_DEPARTMENT_OTHER): Payer: Self-pay

## 2023-06-30 ENCOUNTER — Other Ambulatory Visit (HOSPITAL_BASED_OUTPATIENT_CLINIC_OR_DEPARTMENT_OTHER): Payer: Self-pay

## 2023-07-03 ENCOUNTER — Other Ambulatory Visit (HOSPITAL_BASED_OUTPATIENT_CLINIC_OR_DEPARTMENT_OTHER): Payer: Self-pay

## 2023-07-03 MED ORDER — COMPACT SPACE CHAMBER DEVI
1.0000 | 0 refills | Status: AC
  Filled 2023-07-03 – 2023-11-23 (×2): qty 1, 1d supply, fill #0
  Filled 2023-11-24: qty 1, 30d supply, fill #0

## 2023-07-03 MED ORDER — LEVOFLOXACIN 500 MG PO TABS
500.0000 mg | ORAL_TABLET | Freq: Every day | ORAL | 0 refills | Status: AC
  Filled 2023-07-03: qty 7, 7d supply, fill #0

## 2023-07-03 MED ORDER — GUAIFENESIN ER 1200 MG PO TB12
1200.0000 mg | ORAL_TABLET | Freq: Two times a day (BID) | ORAL | 0 refills | Status: AC
  Filled 2023-07-03: qty 28, 14d supply, fill #0

## 2023-07-03 MED ORDER — PREDNISONE 20 MG PO TABS
40.0000 mg | ORAL_TABLET | Freq: Every day | ORAL | 0 refills | Status: AC
  Filled 2023-07-03: qty 10, 5d supply, fill #0

## 2023-07-05 ENCOUNTER — Other Ambulatory Visit (HOSPITAL_BASED_OUTPATIENT_CLINIC_OR_DEPARTMENT_OTHER): Payer: Self-pay

## 2023-07-06 ENCOUNTER — Other Ambulatory Visit (HOSPITAL_BASED_OUTPATIENT_CLINIC_OR_DEPARTMENT_OTHER): Payer: Self-pay

## 2023-07-06 MED ORDER — AMLODIPINE BESYLATE 5 MG PO TABS
5.0000 mg | ORAL_TABLET | Freq: Two times a day (BID) | ORAL | 2 refills | Status: DC
  Filled 2023-07-06: qty 180, 90d supply, fill #0
  Filled 2023-09-22: qty 180, 90d supply, fill #1
  Filled 2023-12-26: qty 180, 90d supply, fill #2

## 2023-08-15 ENCOUNTER — Ambulatory Visit (INDEPENDENT_AMBULATORY_CARE_PROVIDER_SITE_OTHER): Admitting: Podiatry

## 2023-08-15 ENCOUNTER — Encounter: Payer: Self-pay | Admitting: Podiatry

## 2023-08-15 DIAGNOSIS — M79676 Pain in unspecified toe(s): Secondary | ICD-10-CM

## 2023-08-15 DIAGNOSIS — E1142 Type 2 diabetes mellitus with diabetic polyneuropathy: Secondary | ICD-10-CM

## 2023-08-15 DIAGNOSIS — D2371 Other benign neoplasm of skin of right lower limb, including hip: Secondary | ICD-10-CM

## 2023-08-15 DIAGNOSIS — B351 Tinea unguium: Secondary | ICD-10-CM

## 2023-08-15 NOTE — Progress Notes (Signed)
 Subjective:  Patient ID: [name], male    DOB: [birth year],  MRN: [id number] HPI Chief Complaint  Patient presents with   Debridement    Trim toenails/callus plantar forefoot right - stopped gabapentin, but feet still burn, but trying to tolerate it, diabetic - 6.6    73 y.o. male presents with the above complaint.   ROS: Denies fever chills nausea vomit muscle aches pains calf pain back pain chest pain shortness of breath.  Past Medical History:  Diagnosis Date   Anxiety    Arthritis    back, neck, fingers   Cancer (HCC)    prostate cancer -dx. bx.    Chronic constipation    Depression    Elevated PSA    Frequency of urination    Hearing loss    no aids used   Hemorrhoids    occ. flare ups mostly springtime.   History of gastroesophageal reflux (GERD)    History of ileus    POST OP 2008 SURGERY-- RESOLVED WITHOUT SURGICAL INTERVENTION   History of jaundice as a child    TEEN-- RESOLVED   Hyperlipidemia    Hypertension    Ileus, postoperative (HCC)    after total knee in 2008   Neuromuscular disorder (HCC)    improved numbness/tingling-remains slight tingling/numbness left index finger   Nocturia    OSA on CPAP    cpap settings 10   Pneumonia    Primary localized osteoarthritis of right knee 10/20/2020   Shortness of breath on exertion    Type 2 diabetes mellitus (HCC)    Wears glasses    Past Surgical History:  Procedure Laterality Date   ANTERIOR CERVICAL DECOMP/DISCECTOMY FUSION  jan 2015   fusion   APPENDECTOMY  1963   COLONOSCOPY W/ POLYPECTOMY     JOINT REPLACEMENT     LTKA '08   LYMPHADENECTOMY Bilateral 08/11/2014   Procedure: PELVIC LYMPHADENECTOMY;  Surgeon: Heloise Purpura, MD;  Location: [facility];  Service: Urology;  Laterality: Bilateral;   PROSTATE BIOPSY N/A 07/01/2014   Procedure: BIOPSY TRANSRECTAL ULTRASONIC PROSTATE (TUBP);  Surgeon: Danae Chen, MD;  Location: [facility];  Service: Urology;  Laterality: N/A;   ROBOT  ASSISTED LAPAROSCOPIC RADICAL PROSTATECTOMY N/A 08/11/2014   Procedure: ROBOTIC ASSISTED LAPAROSCOPIC RADICAL PROSTATECTOMY LEVEL 2 UMBILICAL HERNIA REPAIR;  Surgeon: Heloise Purpura, MD;  Location: [facility];  Service: Urology;  Laterality: N/A;   TOTAL KNEE ARTHROPLASTY Left 2008   TOTAL KNEE ARTHROPLASTY Right 11/02/2020   Procedure: TOTAL KNEE ARTHROPLASTY;  Surgeon: Salvatore Marvel, MD;  Location: [facility];  Service: Orthopedics;  Laterality: Right;   TRANSRECTAL ULTRASOUND GUIDED PROSTATE BX  2012   VASECTOMY  2001  w/ gen. anes.    Current Outpatient Medications:    albuterol (VENTOLIN HFA) 108 (90 Base) MCG/ACT inhaler, Inhale 2 puffs into the lungs every 4 (four) hours as needed., Disp: 6.7 g, Rfl: 0   albuterol (VENTOLIN HFA) 108 (90 Base) MCG/ACT inhaler, Inhale 2 puffs into the lungs every 4 (four) hours as needed for wheezing., Disp: 6.7 g, Rfl: 0   amLODipine (NORVASC) 5 MG tablet, TAKE 1 TABLET BY MOUTH ONCE DAILY, Disp: 90 tablet, Rfl: 3   amLODipine (NORVASC) 5 MG tablet, Take 1 tablet (5 mg total) by mouth 2 (two) times daily., Disp: 180 tablet, Rfl: 2   aspirin EC 325 MG tablet, TAKE 1 TABLET BY MOUTH DAILY FOR 30 DAYS AFTER SURGERY TO PREVENT BLOOD CLOTS, Disp: 100 tablet, Rfl: 0  azithromycin (ZITHROMAX) 250 MG tablet, Take 2 tablets (500mg  total) on day 1. THEN take 1 tablet (250mg  total) once daily for 4 days., Disp: 6 tablet, Rfl: 0   benzonatate (TESSALON) 100 MG capsule, Take 1 capsule (100 mg total) by mouth 3 (three) times daily for needed for cough., Disp: 30 capsule, Rfl: 0   benzonatate (TESSALON) 100 MG capsule, Take 1 capsule (100 mg total) by mouth 3 (three) times daily as needed for cough., Disp: 30 capsule, Rfl: 0   benzonatate (TESSALON) 200 MG capsule, Take 1 capsule by mouth 3 times daily as needed for cough, Disp: 21 capsule, Rfl: 0   bisacodyl 5 MG EC tablet, Take 4 tablets as directed., Disp: 25 tablet, Rfl: 0   busPIRone (BUSPAR) 5 MG tablet, Take 1 tablet (5 mg  total) by mouth 2 (two) times daily., Disp: 60 tablet, Rfl: 5   celecoxib (CELEBREX) 200 MG capsule, Take 1 capsule by mouth once a day with food for pain and swelling, Disp: 30 capsule, Rfl: 3   cephALEXin (KEFLEX) 500 MG capsule, Take 1 capsule by mouth four times a day, Disp: 40 capsule, Rfl: 0   COVID-19 mRNA bivalent vaccine, Pfizer, injection, Inject into the muscle., Disp: 0.3 mL, Rfl: 0   COVID-19 mRNA vaccine 2023-2024 (COMIRNATY) syringe, Inject into the muscle., Disp: 0.3 mL, Rfl: 0   docusate sodium (COLACE) 100 MG capsule, TAKE 1 TABLET BY MOUTH TWICE A DAY WHILE ON NARCOTICS, Disp: 100 capsule, Rfl: 3   doxycycline (VIBRA-TABS) 100 MG tablet, Take 1 tablet (100 mg total) by mouth 2 (two) times daily for 7 days. Take with 8 oz water. Do not lie down for at least 30 minutes after., Disp: 14 tablet, Rfl: 0   FLUoxetine (PROZAC) 20 MG capsule, Take 3 capsules (60 mg total) by mouth daily., Disp: 270 capsule, Rfl: 4   FLUoxetine (PROZAC) 40 MG capsule, Take 1 capsule by mouth once daily, Disp: 90 capsule, Rfl: 1   FLUoxetine (PROZAC) 40 MG capsule, TAKE 1 CAPSULE by mouth once a day 90 days, Disp: 90 capsule, Rfl: 4   FLUoxetine (PROZAC) 40 MG capsule, Take 1 capsule (40 mg total) by mouth daily., Disp: 90 capsule, Rfl: 4   FLUoxetine (PROZAC) 40 MG capsule, Take 1 capsule (40 mg total) by mouth daily., Disp: 90 capsule, Rfl: 3   fluticasone (FLONASE) 50 MCG/ACT nasal spray, Use 1 spray in each nostril 2 times daily., Disp: 16 g, Rfl: 0   gabapentin (NEURONTIN) 100 MG capsule, Take 1-2 capsules (100-200 mg total) by mouth daily., Disp: 60 capsule, Rfl: 4   gabapentin (NEURONTIN) 300 MG capsule, TAKE 1 CAPSULE BY MOUTH EVERY NIGHT AT BEDTIME FOR NERVE PAIN, Disp: 30 capsule, Rfl: 0   glucose blood (ONETOUCH VERIO) test strip, Use as directed to check blood sugar once daily., Disp: 100 strip, Rfl: 3   Guaifenesin 1200 MG TB12, Take 1 tablet (1,200 mg total) by mouth 2 (two) times daily., Disp:  28 tablet, Rfl: 0   influenza vaccine adjuvanted (FLUAD QUADRIVALENT) 0.5 ML injection, Inject into the muscle., Disp: 0.5 mL, Rfl: 0   influenza vaccine adjuvanted (FLUAD) 0.5 ML injection, Inject into the muscle., Disp: 0.5 mL, Rfl: 0   levofloxacin (LEVAQUIN) 500 MG tablet, Take 1 tablet (500 mg total) by mouth daily for 7 days., Disp: 7 tablet, Rfl: 0   loratadine (CLARITIN) 10 MG tablet, Take 10 mg by mouth daily as needed for allergies., Disp: , Rfl:    metFORMIN (  GLUCOPHAGE) 500 MG tablet, Take 2 tablets (1,000 mg total) by mouth 2 (two) times daily with food., Disp: 360 tablet, Rfl: 3   methylPREDNISolone (MEDROL DOSEPAK) 4 MG TBPK tablet, Take As Directed On Package for 6 days., Disp: 21 tablet, Rfl: 0   montelukast (SINGULAIR) 10 MG tablet, Take 1 tablet (10 mg total) by mouth at bedtime., Disp: 90 tablet, Rfl: 3   Multiple Vitamin (MULTIVITAMIN WITH MINERALS) TABS tablet, Take 1 tablet by mouth in the morning., Disp: , Rfl:    mupirocin ointment (BACTROBAN) 2 %, Apply to affected area up to three times daily as needed., Disp: 22 g, Rfl: 11   Omega-3 Fatty Acids (FISH OIL) 1000 MG CAPS, Take by mouth. 2000 mg twice a day, Disp: , Rfl:    OneTouch Delica Lancets 33G MISC, Use as directed to check blood sugar once a day., Disp: 100 each, Rfl: 11   ONETOUCH VERIO test strip, daily., Disp: , Rfl:    oxyCODONE (OXY IR/ROXICODONE) 5 MG immediate release tablet, Take 1 tablet (5 mg total) by mouth every 4 (four) hours as needed for severe pain., Disp: 42 tablet, Rfl: 0   polyethylene glycol powder (GLYCOLAX/MIRALAX) 17 GM/SCOOP powder, MIX 17 GRAMS (1 CAP FULL) IN 6 OUNCES OF SOMETHING TO DRINK TWICE A DAY UNTIL BOWELS ARE REGULAR, Disp: 510 g, Rfl: 11   polyethylene glycol-electrolytes (NULYTELY) 420 g solution, Use as directed., Disp: 4000 mL, Rfl: 0   predniSONE (DELTASONE) 20 MG tablet, Take 1 tablet (20 mg total) by mouth 2 (two) times daily., Disp: 10 tablet, Rfl: 0   predniSONE (DELTASONE)  20 MG tablet, Take 2 tablets (40 mg total) by mouth daily for 5 days., Disp: 10 tablet, Rfl: 0   predniSONE (DELTASONE) 5 MG tablet, Take 6 tablets by mouth daily for 4 days, then 4 tablets daily for 4 days, then 2 tablets daily for 4 days, then stop, Disp: 48 tablet, Rfl: 0   ramipril (ALTACE) 10 MG capsule, TAKE 1 CAPSULE BY MOUTH ONCE DAILY, Disp: 90 capsule, Rfl: 3   ramipril (ALTACE) 10 MG capsule, TAKE 1 CAPSULE BY MOUTH ONCE DAILY, Disp: 90 capsule, Rfl: 3   ramipril (ALTACE) 10 MG capsule, TAKE 1 CAPSULE BY MOUTH ONCE DAILY, Disp: 90 capsule, Rfl: 3   ramipril (ALTACE) 10 MG capsule, Take 1 capsule (10 mg total) by mouth daily., Disp: 90 capsule, Rfl: 3   Respiratory Therapy Supplies (ACE AEROSOL CLOUD ENHANCER) MISC, Use as directed., Disp: 1 each, Rfl: 0   Respiratory Therapy Supplies (CARETOUCH CPAP & BIPAP HOSE) MISC, Apply 1 Dose topically See admin instructions., Disp: , Rfl:    simvastatin (ZOCOR) 40 MG tablet, TAKE 1 TABLET BY MOUTH ONCE DAILY, Disp: 31 tablet, Rfl: 4   simvastatin (ZOCOR) 40 MG tablet, TAKE 1 TABLET BY MOUTH ONCE DAILY, Disp: 31 tablet, Rfl: 5   simvastatin (ZOCOR) 40 MG tablet, Take 1 tablet (40 mg total) by mouth daily., Disp: 90 tablet, Rfl: 3   SUPER B COMPLEX/C PO, , Disp: , Rfl:    valsartan (DIOVAN) 320 MG tablet, Take 1 tablet (320 mg total) by mouth daily., Disp: 90 tablet, Rfl: 5  Allergies  Allergen Reactions   Morphine And Codeine Other (See Comments)    Confusion and hallucinations   Review of Systems Objective:  There were no vitals filed for this visit.  General: Well developed, nourished, in no acute distress, alert and oriented x3   Dermatological: Skin is warm, dry and supple bilateral.  Nails x 10 are thick yellow dystrophic clinically mycotic.  Remaining integument appears unremarkable at this time. There are no open sores, no preulcerative lesions, no rash or signs of infection present.  He does have a solitary porokeratotic lesion  plantar aspect of the second metatarsal head right foot secondary to severe hammertoe deformity right.  Vascular: Dorsalis Pedis artery and Posterior Tibial artery pedal pulses are 2/4 bilateral with immedate capillary fill time. Pedal hair growth present. No varicosities and no lower extremity edema present bilateral.   Neruologic: Grossly intact via light touch bilateral. Vibratory intact via tuning fork bilateral. Protective threshold with Semmes Wienstein monofilament intact to all pedal sites bilateral. Patellar and Achilles deep tendon reflexes 2+ bilateral. No Babinski or clonus noted bilateral.   Musculoskeletal: No gross boney pedal deformities bilateral. No pain, crepitus, or limitation noted with foot and ankle range of motion bilateral. Muscular strength 5/5 in all groups tested bilateral.  Gait: Unassisted, Nonantalgic.    Radiographs:  None taken  Assessment & Plan:   Assessment: Pain in limb secondary to neuropathy.  Pain in limb secondary to onychomycosis.  Pain in limb secondary to benign skin lesion.  Plan: Debrided benign skin lesion.  Debrided toenails 1 through 5 bilateral.  He does not want to continue to take the gabapentin because of the cost of the co-pay.  He has already discontinued that.     Shiron Whetsel T. [place], [place]

## 2023-09-10 ENCOUNTER — Other Ambulatory Visit (HOSPITAL_BASED_OUTPATIENT_CLINIC_OR_DEPARTMENT_OTHER): Payer: Self-pay

## 2023-09-11 ENCOUNTER — Other Ambulatory Visit: Payer: Self-pay

## 2023-09-11 ENCOUNTER — Other Ambulatory Visit (HOSPITAL_BASED_OUTPATIENT_CLINIC_OR_DEPARTMENT_OTHER): Payer: Self-pay

## 2023-09-11 MED ORDER — VALSARTAN 320 MG PO TABS
160.0000 mg | ORAL_TABLET | Freq: Two times a day (BID) | ORAL | 3 refills | Status: DC
  Filled 2023-09-11: qty 90, 90d supply, fill #0
  Filled 2024-01-09 – 2024-03-05 (×2): qty 90, 90d supply, fill #1

## 2023-09-22 ENCOUNTER — Other Ambulatory Visit (HOSPITAL_BASED_OUTPATIENT_CLINIC_OR_DEPARTMENT_OTHER): Payer: Self-pay

## 2023-10-12 ENCOUNTER — Other Ambulatory Visit (HOSPITAL_BASED_OUTPATIENT_CLINIC_OR_DEPARTMENT_OTHER): Payer: Self-pay

## 2023-10-12 MED ORDER — AMLODIPINE BESYLATE 5 MG PO TABS
5.0000 mg | ORAL_TABLET | Freq: Two times a day (BID) | ORAL | 5 refills | Status: AC
  Filled 2023-11-22 – 2024-03-25 (×2): qty 90, 45d supply, fill #0
  Filled 2024-04-08: qty 180, 90d supply, fill #0

## 2023-10-12 MED ORDER — METFORMIN HCL 500 MG PO TABS
1000.0000 mg | ORAL_TABLET | Freq: Two times a day (BID) | ORAL | 5 refills | Status: AC
  Filled 2023-11-21: qty 360, 90d supply, fill #0
  Filled 2024-01-09 – 2024-02-13 (×2): qty 360, 90d supply, fill #1
  Filled 2024-05-13: qty 360, 90d supply, fill #2

## 2023-10-12 MED ORDER — FLUOXETINE HCL 40 MG PO CAPS
40.0000 mg | ORAL_CAPSULE | Freq: Every day | ORAL | 5 refills | Status: AC
  Filled 2023-11-22: qty 90, 90d supply, fill #0
  Filled 2024-06-03: qty 90, 90d supply, fill #1

## 2023-10-12 MED ORDER — VALSARTAN 320 MG PO TABS
160.0000 mg | ORAL_TABLET | Freq: Two times a day (BID) | ORAL | 5 refills | Status: DC
  Filled 2023-11-22 – 2023-12-04 (×2): qty 90, 90d supply, fill #0

## 2023-10-12 MED ORDER — SIMVASTATIN 40 MG PO TABS
40.0000 mg | ORAL_TABLET | Freq: Every day | ORAL | 5 refills | Status: AC
  Filled 2024-01-02 – 2024-01-09 (×3): qty 90, 90d supply, fill #0
  Filled 2024-04-02: qty 90, 90d supply, fill #1

## 2023-11-15 ENCOUNTER — Ambulatory Visit: Admitting: Podiatry

## 2023-11-22 ENCOUNTER — Other Ambulatory Visit: Payer: Self-pay

## 2023-11-22 ENCOUNTER — Other Ambulatory Visit (HOSPITAL_BASED_OUTPATIENT_CLINIC_OR_DEPARTMENT_OTHER): Payer: Self-pay

## 2023-11-23 ENCOUNTER — Other Ambulatory Visit: Payer: Self-pay

## 2023-11-23 ENCOUNTER — Other Ambulatory Visit (HOSPITAL_BASED_OUTPATIENT_CLINIC_OR_DEPARTMENT_OTHER): Payer: Self-pay

## 2023-11-24 ENCOUNTER — Other Ambulatory Visit (HOSPITAL_BASED_OUTPATIENT_CLINIC_OR_DEPARTMENT_OTHER): Payer: Self-pay

## 2023-11-28 ENCOUNTER — Other Ambulatory Visit (HOSPITAL_BASED_OUTPATIENT_CLINIC_OR_DEPARTMENT_OTHER): Payer: Self-pay

## 2023-11-29 ENCOUNTER — Other Ambulatory Visit (HOSPITAL_BASED_OUTPATIENT_CLINIC_OR_DEPARTMENT_OTHER): Payer: Self-pay

## 2023-12-04 ENCOUNTER — Other Ambulatory Visit (HOSPITAL_BASED_OUTPATIENT_CLINIC_OR_DEPARTMENT_OTHER): Payer: Self-pay

## 2023-12-07 ENCOUNTER — Encounter: Payer: Self-pay | Admitting: Podiatry

## 2023-12-07 ENCOUNTER — Ambulatory Visit (INDEPENDENT_AMBULATORY_CARE_PROVIDER_SITE_OTHER): Admitting: Podiatry

## 2023-12-07 ENCOUNTER — Other Ambulatory Visit (HOSPITAL_BASED_OUTPATIENT_CLINIC_OR_DEPARTMENT_OTHER): Payer: Self-pay

## 2023-12-07 DIAGNOSIS — E1142 Type 2 diabetes mellitus with diabetic polyneuropathy: Secondary | ICD-10-CM

## 2023-12-07 DIAGNOSIS — B351 Tinea unguium: Secondary | ICD-10-CM

## 2023-12-07 DIAGNOSIS — M79676 Pain in unspecified toe(s): Secondary | ICD-10-CM

## 2023-12-07 NOTE — Progress Notes (Signed)
 This patient returns to my office for at risk foot care.  This patient requires this care by a professional since this patient will be at risk due to having diabetes.   This patient is unable to cut nails himself since the patient cannot reach his nails.These nails are painful walking and wearing shoes.  This patient presents for at risk foot care today.  General Appearance  Alert, conversant and in no acute stress.  Vascular  Dorsalis pedis and posterior tibial  pulses are palpable  bilaterally.  Capillary return is within normal limits  bilaterally. Temperature is within normal limits  bilaterally.  Neurologic  Senn-Weinstein monofilament wire test within normal limits  bilaterally. Muscle power within normal limits bilaterally.  Nails Thick disfigured discolored nails with subungual debris  from hallux to fifth toes bilaterally. No evidence of bacterial infection or drainage bilaterally.  Orthopedic  No limitations of motion  feet .  No crepitus or effusions noted.  No bony pathology or digital deformities noted.  DJD midfoot left.  DJD 1st MPJ left.  Skin  normotropic skin with no porokeratosis noted bilaterally.  No signs of infections or ulcers noted.     Onychomycosis  Pain in right toes  Pain in left toes  Consent was obtained for treatment procedures.   Mechanical debridement of nails 1-5  bilaterally performed with a nail nipper.  Filed with dremel without incident.    Return office visit     3 months                 Told patient to return for periodic foot care and evaluation due to potential at risk complications.   Cordella Bold DPM

## 2023-12-14 ENCOUNTER — Other Ambulatory Visit (HOSPITAL_BASED_OUTPATIENT_CLINIC_OR_DEPARTMENT_OTHER): Payer: Self-pay

## 2023-12-26 ENCOUNTER — Other Ambulatory Visit (HOSPITAL_BASED_OUTPATIENT_CLINIC_OR_DEPARTMENT_OTHER): Payer: Self-pay

## 2024-01-02 ENCOUNTER — Other Ambulatory Visit (HOSPITAL_BASED_OUTPATIENT_CLINIC_OR_DEPARTMENT_OTHER): Payer: Self-pay

## 2024-01-09 ENCOUNTER — Other Ambulatory Visit (HOSPITAL_COMMUNITY): Payer: Self-pay

## 2024-01-09 ENCOUNTER — Other Ambulatory Visit (HOSPITAL_BASED_OUTPATIENT_CLINIC_OR_DEPARTMENT_OTHER): Payer: Self-pay

## 2024-01-09 ENCOUNTER — Other Ambulatory Visit: Payer: Self-pay

## 2024-01-23 ENCOUNTER — Other Ambulatory Visit (HOSPITAL_BASED_OUTPATIENT_CLINIC_OR_DEPARTMENT_OTHER): Payer: Self-pay

## 2024-03-06 ENCOUNTER — Other Ambulatory Visit (HOSPITAL_BASED_OUTPATIENT_CLINIC_OR_DEPARTMENT_OTHER): Payer: Self-pay

## 2024-03-07 ENCOUNTER — Ambulatory Visit: Admitting: Podiatry

## 2024-03-19 ENCOUNTER — Ambulatory Visit: Admitting: Podiatry

## 2024-03-25 ENCOUNTER — Other Ambulatory Visit (HOSPITAL_BASED_OUTPATIENT_CLINIC_OR_DEPARTMENT_OTHER): Payer: Self-pay

## 2024-04-05 ENCOUNTER — Other Ambulatory Visit (HOSPITAL_BASED_OUTPATIENT_CLINIC_OR_DEPARTMENT_OTHER): Payer: Self-pay

## 2024-04-08 ENCOUNTER — Other Ambulatory Visit: Payer: Self-pay

## 2024-04-08 ENCOUNTER — Other Ambulatory Visit (HOSPITAL_BASED_OUTPATIENT_CLINIC_OR_DEPARTMENT_OTHER): Payer: Self-pay

## 2024-04-11 ENCOUNTER — Encounter: Payer: Self-pay | Admitting: Podiatry

## 2024-04-11 ENCOUNTER — Ambulatory Visit (INDEPENDENT_AMBULATORY_CARE_PROVIDER_SITE_OTHER): Admitting: Podiatry

## 2024-04-11 ENCOUNTER — Other Ambulatory Visit (HOSPITAL_BASED_OUTPATIENT_CLINIC_OR_DEPARTMENT_OTHER): Payer: Self-pay

## 2024-04-11 DIAGNOSIS — B351 Tinea unguium: Secondary | ICD-10-CM

## 2024-04-11 DIAGNOSIS — M79676 Pain in unspecified toe(s): Secondary | ICD-10-CM | POA: Diagnosis not present

## 2024-04-11 DIAGNOSIS — E1142 Type 2 diabetes mellitus with diabetic polyneuropathy: Secondary | ICD-10-CM | POA: Diagnosis not present

## 2024-04-11 MED ORDER — DONEPEZIL HCL 5 MG PO TABS
5.0000 mg | ORAL_TABLET | Freq: Every evening | ORAL | 5 refills | Status: AC
  Filled 2024-04-11: qty 30, 30d supply, fill #0
  Filled 2024-05-06: qty 30, 30d supply, fill #1
  Filled 2024-06-03: qty 30, 30d supply, fill #2

## 2024-04-11 NOTE — Progress Notes (Signed)
 This patient returns to my office for at risk foot care.  This patient requires this care by a professional since this patient will be at risk due to having diabetes.   This patient is unable to cut nails himself since the patient cannot reach his nails.These nails are painful walking and wearing shoes.  This patient presents for at risk foot care today.  General Appearance  Alert, conversant and in no acute stress.  Vascular  Dorsalis pedis and posterior tibial  pulses are palpable  bilaterally.  Capillary return is within normal limits  bilaterally. Temperature is within normal limits  bilaterally.  Neurologic  Senn-Weinstein monofilament wire test within normal limits  bilaterally. Muscle power within normal limits bilaterally.  Nails Thick disfigured discolored nails with subungual debris  from hallux to fifth toes bilaterally. No evidence of bacterial infection or drainage bilaterally.  Orthopedic  No limitations of motion  feet .  No crepitus or effusions noted.  No bony pathology or digital deformities noted.  DJD midfoot left.  DJD 1st MPJ left.  Skin  normotropic skin with no porokeratosis noted bilaterally.  No signs of infections or ulcers noted.     Onychomycosis  Pain in right toes  Pain in left toes  Consent was obtained for treatment procedures.   Mechanical debridement of nails 1-5  bilaterally performed with a nail nipper.  Filed with dremel without incident.    Return office visit     3 months                 Told patient to return for periodic foot care and evaluation due to potential at risk complications.   Cordella Bold DPM

## 2024-05-09 ENCOUNTER — Other Ambulatory Visit (HOSPITAL_BASED_OUTPATIENT_CLINIC_OR_DEPARTMENT_OTHER): Payer: Self-pay

## 2024-05-10 ENCOUNTER — Other Ambulatory Visit (HOSPITAL_BASED_OUTPATIENT_CLINIC_OR_DEPARTMENT_OTHER): Payer: Self-pay

## 2024-05-10 MED ORDER — AMLODIPINE BESYLATE 5 MG PO TABS
5.0000 mg | ORAL_TABLET | Freq: Two times a day (BID) | ORAL | 5 refills | Status: AC
  Filled 2024-05-10: qty 180, 90d supply, fill #0

## 2024-05-30 ENCOUNTER — Other Ambulatory Visit (HOSPITAL_BASED_OUTPATIENT_CLINIC_OR_DEPARTMENT_OTHER): Payer: Self-pay

## 2024-07-10 ENCOUNTER — Ambulatory Visit: Admitting: Podiatry
